# Patient Record
Sex: Female | Born: 1965 | Race: Black or African American | Hispanic: No | Smoking: Never smoker
Health system: Southern US, Community
[De-identification: ages and names within clinical notes are randomized; demographics above are authoritative.]

## PROBLEM LIST (undated history)

## (undated) DIAGNOSIS — R232 Flushing: Secondary | ICD-10-CM

## (undated) DIAGNOSIS — N852 Hypertrophy of uterus: Secondary | ICD-10-CM

## (undated) DIAGNOSIS — F32A Depression, unspecified: Secondary | ICD-10-CM

## (undated) DIAGNOSIS — R1031 Right lower quadrant pain: Secondary | ICD-10-CM

## (undated) DIAGNOSIS — I10 Essential (primary) hypertension: Secondary | ICD-10-CM

## (undated) DIAGNOSIS — F329 Major depressive disorder, single episode, unspecified: Secondary | ICD-10-CM

## (undated) DIAGNOSIS — F419 Anxiety disorder, unspecified: Secondary | ICD-10-CM

## (undated) DIAGNOSIS — E785 Hyperlipidemia, unspecified: Secondary | ICD-10-CM

## (undated) HISTORY — DX: Flushing: R23.2

## (undated) HISTORY — DX: Hypertrophy of uterus: N85.2

## (undated) HISTORY — DX: Essential (primary) hypertension: I10

## (undated) HISTORY — DX: Right lower quadrant pain: R10.31

## (undated) HISTORY — DX: Major depressive disorder, single episode, unspecified: F32.9

## (undated) HISTORY — DX: Anxiety disorder, unspecified: F41.9

## (undated) HISTORY — DX: Hyperlipidemia, unspecified: E78.5

## (undated) HISTORY — DX: Depression, unspecified: F32.A

## (undated) HISTORY — PX: KNEE SURGERY: SHX244

---

## 2001-01-03 ENCOUNTER — Ambulatory Visit (HOSPITAL_COMMUNITY): Admission: RE | Admit: 2001-01-03 | Discharge: 2001-01-03 | Payer: Self-pay | Admitting: Internal Medicine

## 2001-01-03 ENCOUNTER — Encounter: Payer: Self-pay | Admitting: Internal Medicine

## 2003-04-29 ENCOUNTER — Emergency Department (HOSPITAL_COMMUNITY): Admission: EM | Admit: 2003-04-29 | Discharge: 2003-04-29 | Payer: Self-pay | Admitting: Emergency Medicine

## 2004-08-17 ENCOUNTER — Ambulatory Visit: Payer: Self-pay | Admitting: Orthopedic Surgery

## 2004-10-18 ENCOUNTER — Ambulatory Visit: Payer: Self-pay | Admitting: Orthopedic Surgery

## 2004-11-19 ENCOUNTER — Ambulatory Visit (HOSPITAL_COMMUNITY): Admission: RE | Admit: 2004-11-19 | Discharge: 2004-11-19 | Payer: Self-pay | Admitting: Orthopedic Surgery

## 2004-11-23 ENCOUNTER — Encounter (HOSPITAL_COMMUNITY): Admission: RE | Admit: 2004-11-23 | Discharge: 2004-12-18 | Payer: Self-pay | Admitting: Orthopedic Surgery

## 2004-11-24 ENCOUNTER — Ambulatory Visit: Payer: Self-pay | Admitting: Orthopedic Surgery

## 2004-12-22 ENCOUNTER — Ambulatory Visit: Payer: Self-pay | Admitting: Orthopedic Surgery

## 2007-01-01 ENCOUNTER — Ambulatory Visit (HOSPITAL_COMMUNITY): Admission: RE | Admit: 2007-01-01 | Discharge: 2007-01-01 | Payer: Self-pay | Admitting: Obstetrics and Gynecology

## 2007-04-09 ENCOUNTER — Other Ambulatory Visit: Admission: RE | Admit: 2007-04-09 | Discharge: 2007-04-09 | Payer: Self-pay | Admitting: Obstetrics and Gynecology

## 2008-01-30 ENCOUNTER — Ambulatory Visit (HOSPITAL_COMMUNITY): Admission: RE | Admit: 2008-01-30 | Discharge: 2008-01-30 | Payer: Self-pay | Admitting: Internal Medicine

## 2008-05-15 ENCOUNTER — Other Ambulatory Visit: Admission: RE | Admit: 2008-05-15 | Discharge: 2008-05-15 | Payer: Self-pay | Admitting: Obstetrics and Gynecology

## 2009-02-11 ENCOUNTER — Ambulatory Visit (HOSPITAL_COMMUNITY): Admission: RE | Admit: 2009-02-11 | Discharge: 2009-02-11 | Payer: Self-pay | Admitting: Internal Medicine

## 2010-03-11 ENCOUNTER — Other Ambulatory Visit
Admission: RE | Admit: 2010-03-11 | Discharge: 2010-03-11 | Payer: Self-pay | Source: Home / Self Care | Admitting: Obstetrics and Gynecology

## 2010-03-11 ENCOUNTER — Ambulatory Visit (HOSPITAL_COMMUNITY)
Admission: RE | Admit: 2010-03-11 | Discharge: 2010-03-11 | Payer: Self-pay | Source: Home / Self Care | Attending: Obstetrics and Gynecology | Admitting: Obstetrics and Gynecology

## 2010-03-24 ENCOUNTER — Ambulatory Visit (HOSPITAL_COMMUNITY)
Admission: RE | Admit: 2010-03-24 | Discharge: 2010-03-24 | Payer: Self-pay | Source: Home / Self Care | Attending: Obstetrics and Gynecology | Admitting: Obstetrics and Gynecology

## 2010-04-11 ENCOUNTER — Encounter: Payer: Self-pay | Admitting: Internal Medicine

## 2010-04-11 ENCOUNTER — Encounter: Payer: Self-pay | Admitting: Obstetrics and Gynecology

## 2010-06-24 ENCOUNTER — Other Ambulatory Visit: Payer: Self-pay | Admitting: Internal Medicine

## 2010-06-24 DIAGNOSIS — Z09 Encounter for follow-up examination after completed treatment for conditions other than malignant neoplasm: Secondary | ICD-10-CM

## 2010-06-24 DIAGNOSIS — N63 Unspecified lump in unspecified breast: Secondary | ICD-10-CM

## 2010-08-06 NOTE — Op Note (Signed)
NAME:  [name], [name]              ACCOUNT NO.:  [id number]   MEDICAL RECORD NO.:  [id number]          PATIENT TYPE:  AMB   LOCATION:  DAY                           FACILITY:  [facility]   PHYSICIAN:  Vickki Hearing, M.D.DATE OF BIRTH:  08-14-1965   DATE OF PROCEDURE:  11/19/2004  DATE OF DISCHARGE:                                 OPERATIVE REPORT   PREOPERATIVE DIAGNOSIS:  Medial meniscal tear, left knee.   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   PROCEDURE:  Arthroscopy left knee lateral meniscectomy and chondroplasty of  the medial patellar facet.   SURGEON:  Vickki Hearing, M.D.   SPECIMENS:  None.   ESTIMATED BLOOD LOSS:  None.   COMPLICATIONS:  None.   DISPOSITION:  At the end of the procedure, the patient went to PACU in good  condition.   INDICATIONS:  Medial knee pain.   INDICATIONS:  This is a 45 year old female who was followed for several  months with pain in the medial aspect of her left knee.  She was treated  with anti-inflammatories and rest.  Her  mechanical symptoms continued, and  she presented for surgery after failed conservative therapy.   DESCRIPTION OF PROCEDURE:  The patient was identified in the preoperative  holding area as Carrie Santos.  Her left knee was marked for surgery and  countersigned by the surgeon.  History and physical was updated.  She was  given Ancef and taken to the operating for general anesthesia via LMA  technique.   After sterile prep and drape. A time-out was taken as required and completed  as required. The procedure was confirmed as left knee arthroscopy on Carrie Santos.   Using a lateral working viewing portal and medial working portal, a  diagnostic arthroscopy was performed.  Using a probe the intra-articular  structures were palpated. There was a free edge tear of the lateral  meniscus.  The medial meniscus was normal, and the medial compartment was  normal.   The medial facet of the patella had a grade I lesion with a  chondral flap of the medial facet.  There was some slight lateral  subluxation which corrected on knee flexion.   This was debrided with a straight shaver first in the lateral compartment  for the meniscus and then on the medial facet for the patella.   The knee was irrigated and the portals were closed with Steri-Strips. We  injected 30 cc of plain Marcaine in the knee.  We dressed the wound  sterilely, put on a cryo cuff over the bandages.  We extubated the patient  and took her to the recovery room again in stable condition.   DISCHARGE INSTRUCTIONS:  1.  She is discharged.  Follow up on Wednesday.  2.  Discharged on Lorcet Plus.  3.  She will take a cryo cuff home.  4.  She is full weightbearing.  Physical therapy can start on Tuesday.      Vickki Hearing, M.D.  Electronically Signed     SEH/MEDQ  D:  11/19/2004  T:  11/19/2004  Job:  [id number]

## 2010-08-06 NOTE — H&P (Signed)
NAME:  Carrie Santos, Carrie Santos              ACCOUNT NO.:  [id number]   MEDICAL RECORD NO.:  [id number]          PATIENT TYPE:  AMB   LOCATION:  DAY                           FACILITY:  [facility]   PHYSICIAN:  Vickki Hearing, M.D.DATE OF BIRTH:  Aug 10, 1965   DATE OF ADMISSION:  DATE OF DISCHARGE:  LH                                HISTORY & PHYSICAL   CHIEF COMPLAINT:  Left knee pain.   HISTORY OF PRESENT ILLNESS:  She is a 45 year old female who presented in  May of 2006 after twisting her knee.  She complains of ambulatory  dysfunction, medial knee pain, swelling, which is mild.  She has tried Aleve  with mild relief.  She did eventually come to have continued pain, catching,  and giving way of the left knee.  She was treated with NSAIDS and rest.  She  continued to have pain and mechanical symptoms and is diagnosed with medial  meniscal tear and presents for surgery.   REVIEW OF SYSTEMS:  Reveals no problems.  We reviewed 10 systems.  They were  normal.   ALLERGIES:  She is allergic to MICROBID.   PAST MEDICAL HISTORY:  She has no medical problems.   MEDICATIONS:  She takes a birth control pill and Lipitor.   PAST SURGICAL HISTORY:  No previous surgery.   FAMILY HISTORY:  She has a family history of diabetes.   SOCIAL HISTORY:  She is married.  She is a Child psychotherapist.  Family physician  is Dr. Felecia Shelling.  She does not smoke or drink.  She drinks caffeine two cups  per day.  She has 18 years of schooling to her credit.   PHYSICAL EXAMINATION:  GENERAL:  She is well-developed and nourished.  Grooming and hygiene are normal.  She has no gross deformity.  Body habitus  is mesomorphic.  She is alert and oriented x3 with normal sensation in the  limbs.  CARDIOVASCULAR:  Showed normal observation and palpation.  \  SKIN:  Her skin was normal in all four extremities.  MUSCULOSKELETAL:  Revealed range of motion of 0-125 degrees with some pain.  Strength and stability were normal.  On  inspection she had tenderness and  pain with flexion of the knee, a positive meniscal sign, and a mild joint  effusion.  Her other extremities showed normal range of motion, strength,  stability, alignment, and appearance.   LABORATORY DATA:  Radiographs:  Two views of the knee showed no acute  changes.   DIAGNOSIS:  Medial meniscal tear.   PLAN:  Arthroscopy, left knee.  Medial meniscectomy.   The patient has failed conservative treatment and agreed to proceed with  surgery because of persistent pain.  She understands the risks and benefits  of the procedure and agrees to proceed with arthroscopy of the left knee.      Vickki Hearing, M.D.  Electronically Signed     SEH/MEDQ  D:  11/18/2004  T:  11/18/2004  Job:  [id number]

## 2010-09-28 ENCOUNTER — Ambulatory Visit
Admission: RE | Admit: 2010-09-28 | Discharge: 2010-09-28 | Disposition: A | Discharge status: Home / Self Care | Payer: BC Managed Care – PPO | Source: Ambulatory Visit | Attending: Internal Medicine | Admitting: Internal Medicine

## 2010-09-28 ENCOUNTER — Other Ambulatory Visit: Payer: Self-pay | Admitting: Internal Medicine

## 2010-09-28 DIAGNOSIS — N63 Unspecified lump in unspecified breast: Secondary | ICD-10-CM

## 2011-03-01 ENCOUNTER — Other Ambulatory Visit: Payer: Self-pay | Admitting: Internal Medicine

## 2011-03-01 DIAGNOSIS — N63 Unspecified lump in unspecified breast: Secondary | ICD-10-CM

## 2011-03-01 DIAGNOSIS — N6009 Solitary cyst of unspecified breast: Secondary | ICD-10-CM

## 2011-04-11 ENCOUNTER — Other Ambulatory Visit (HOSPITAL_COMMUNITY)
Admission: RE | Admit: 2011-04-11 | Discharge: 2011-04-11 | Disposition: A | Discharge status: Home / Self Care | Payer: BC Managed Care – PPO | Source: Ambulatory Visit | Attending: Obstetrics and Gynecology | Admitting: Obstetrics and Gynecology

## 2011-04-11 ENCOUNTER — Ambulatory Visit
Admission: RE | Admit: 2011-04-11 | Discharge: 2011-04-11 | Disposition: A | Discharge status: Home / Self Care | Payer: BC Managed Care – PPO | Source: Ambulatory Visit | Attending: Internal Medicine | Admitting: Internal Medicine

## 2011-04-11 ENCOUNTER — Other Ambulatory Visit: Payer: Self-pay | Admitting: Adult Health

## 2011-04-11 DIAGNOSIS — N6009 Solitary cyst of unspecified breast: Secondary | ICD-10-CM

## 2011-04-11 DIAGNOSIS — Z113 Encounter for screening for infections with a predominantly sexual mode of transmission: Secondary | ICD-10-CM | POA: Insufficient documentation

## 2011-04-11 DIAGNOSIS — N63 Unspecified lump in unspecified breast: Secondary | ICD-10-CM

## 2011-04-11 DIAGNOSIS — Z01419 Encounter for gynecological examination (general) (routine) without abnormal findings: Secondary | ICD-10-CM | POA: Insufficient documentation

## 2012-11-21 ENCOUNTER — Other Ambulatory Visit: Payer: Self-pay | Admitting: Family Medicine

## 2012-11-21 ENCOUNTER — Other Ambulatory Visit: Payer: Self-pay | Admitting: *Deleted

## 2012-11-21 DIAGNOSIS — N6009 Solitary cyst of unspecified breast: Secondary | ICD-10-CM

## 2012-11-21 DIAGNOSIS — N63 Unspecified lump in unspecified breast: Secondary | ICD-10-CM

## 2012-12-10 ENCOUNTER — Ambulatory Visit: Payer: BC Managed Care – PPO

## 2012-12-10 ENCOUNTER — Ambulatory Visit
Admission: RE | Admit: 2012-12-10 | Discharge: 2012-12-10 | Disposition: A | Discharge status: Home / Self Care | Payer: BC Managed Care – PPO | Source: Ambulatory Visit | Attending: *Deleted | Admitting: *Deleted

## 2012-12-10 ENCOUNTER — Other Ambulatory Visit: Payer: Self-pay | Admitting: Family Medicine

## 2012-12-10 DIAGNOSIS — N6009 Solitary cyst of unspecified breast: Secondary | ICD-10-CM

## 2012-12-10 DIAGNOSIS — N63 Unspecified lump in unspecified breast: Secondary | ICD-10-CM

## 2013-12-11 ENCOUNTER — Other Ambulatory Visit: Payer: Self-pay

## 2013-12-11 ENCOUNTER — Ambulatory Visit
Admission: RE | Admit: 2013-12-11 | Discharge: 2013-12-11 | Disposition: A | Discharge status: Home / Self Care | Payer: BC Managed Care – PPO | Source: Ambulatory Visit

## 2013-12-11 ENCOUNTER — Encounter (INDEPENDENT_AMBULATORY_CARE_PROVIDER_SITE_OTHER): Payer: Self-pay

## 2013-12-11 DIAGNOSIS — Z1231 Encounter for screening mammogram for malignant neoplasm of breast: Secondary | ICD-10-CM

## 2014-04-07 ENCOUNTER — Other Ambulatory Visit: Payer: Self-pay | Admitting: Adult Health

## 2014-10-22 ENCOUNTER — Other Ambulatory Visit: Payer: Self-pay | Admitting: Adult Health

## 2014-11-05 ENCOUNTER — Other Ambulatory Visit: Payer: Self-pay | Admitting: Adult Health

## 2014-11-18 ENCOUNTER — Other Ambulatory Visit: Payer: Self-pay | Admitting: Adult Health

## 2014-12-02 ENCOUNTER — Ambulatory Visit (INDEPENDENT_AMBULATORY_CARE_PROVIDER_SITE_OTHER): Payer: BC Managed Care – PPO | Admitting: Adult Health

## 2014-12-02 ENCOUNTER — Other Ambulatory Visit (HOSPITAL_COMMUNITY)
Admission: RE | Admit: 2014-12-02 | Discharge: 2014-12-02 | Disposition: A | Discharge status: Home / Self Care | Payer: BC Managed Care – PPO | Source: Ambulatory Visit | Attending: Adult Health | Admitting: Adult Health

## 2014-12-02 ENCOUNTER — Encounter: Payer: Self-pay | Admitting: Adult Health

## 2014-12-02 VITALS — BP 150/90 | HR 84 | Ht 67.0 in | Wt 210.0 lb

## 2014-12-02 DIAGNOSIS — Z1151 Encounter for screening for human papillomavirus (HPV): Secondary | ICD-10-CM | POA: Diagnosis present

## 2014-12-02 DIAGNOSIS — Z1212 Encounter for screening for malignant neoplasm of rectum: Secondary | ICD-10-CM

## 2014-12-02 DIAGNOSIS — N852 Hypertrophy of uterus: Secondary | ICD-10-CM

## 2014-12-02 DIAGNOSIS — R232 Flushing: Secondary | ICD-10-CM

## 2014-12-02 DIAGNOSIS — Z01419 Encounter for gynecological examination (general) (routine) without abnormal findings: Secondary | ICD-10-CM

## 2014-12-02 DIAGNOSIS — R1031 Right lower quadrant pain: Secondary | ICD-10-CM | POA: Insufficient documentation

## 2014-12-02 HISTORY — DX: Right lower quadrant pain: R10.31

## 2014-12-02 HISTORY — DX: Hypertrophy of uterus: N85.2

## 2014-12-02 HISTORY — DX: Flushing: R23.2

## 2014-12-02 LAB — HEMOCCULT GUIAC POC 1CARD (OFFICE): FECAL OCCULT BLD: NEGATIVE

## 2014-12-02 MED ORDER — IBUPROFEN 800 MG PO TABS: 800.0000 mg | ORAL_TABLET | Freq: Three times a day (TID) | ORAL | Status: DC | PRN

## 2014-12-02 NOTE — Progress Notes (Signed)
Patient ID: Carrie Santos, female   DOB: 01-21-1966, 49 y.o.   MRN: [id number] History of Present Illness: Carrie Santos is a 49 year old black female,married in for a well woman gyn exam and pap and she complains of right sided pain and hot flashes.Periods are regular. PCP in [place], lives in [place] now.  Current Medications, Allergies, Past Medical History, Past Surgical History, Family History and Social History were reviewed in [employer] record.     Review of Systems: Patient denies any headaches, hearing loss, fatigue, blurred vision, shortness of breath, chest pain, problems with bowel movements, urination, or intercourse. No joint pain or mood swings.See HPI for positives.    Physical Exam:BP 150/90 mmHg  Pulse 84  Ht 5\' 7"  (1.702 m)  Wt 210 lb (95.255 kg)  BMI 32.88 kg/m2  LMP 11/22/2014 General:  Well developed, well nourished, no acute distress Skin:  Warm and dry Neck:  Midline trachea, normal thyroid, good ROM, no lymphadenopathy Lungs; Clear to auscultation bilaterally Breast:  No dominant palpable mass, retraction, or nipple discharge Cardiovascular: Regular rate and rhythm Abdomen:  Soft, non tender, no hepatosplenomegaly Pelvic:  External genitalia is normal in appearance, no lesions.  The vagina is normal in appearance. Urethra has no lesions or masses. The cervix is bulbous. Pap with HPV performed. Uterus is felt to be enlarged,about 18 week size and irregular to right and tender.  No adnexal masses,RLQ tenderness noted.Bladder is non tender, no masses felt. Rectal: Good sphincter tone, no polyps, or hemorrhoids felt.  Hemoccult negative. Extremities/musculoskeletal:  No swelling or varicosities noted, no clubbing or cyanosis Psych:  No mood changes, alert and cooperative,seems happy Discussed could be fibroids will get [place].  Impression: Well woman gyn exam and pap RLQ pain Enlarged uterus Hot flashes    Plan: Return in 1 day for gyn  [place] Physical in 1 year,pap in 3 if normal Mammogram yearly Colonoscopy advised Labs with PCP Rx motrin 800 mg #60 take 1 every 8 hours prn pain with 1 refill Review handout on pelvic pain and menopause

## 2014-12-02 NOTE — Patient Instructions (Addendum)
Pelvic Pain Female pelvic pain can be caused by many different things and start from a variety of places. Pelvic pain refers to pain that is located in the lower half of the abdomen and between your hips. The pain may occur over a short period of time (acute) or may be reoccurring (chronic). The cause of pelvic pain may be related to disorders affecting the female reproductive organs (gynecologic), but it may also be related to the bladder, kidney stones, an intestinal complication, or muscle or skeletal problems. Getting help right away for pelvic pain is important, especially if there has been severe, sharp, or a sudden onset of unusual pain. It is also important to get help right away because some types of pelvic pain can be life threatening.  CAUSES  Below are only some of the causes of pelvic pain. The causes of pelvic pain can be in one of several categories.   Gynecologic.  Pelvic inflammatory disease.  Sexually transmitted infection.  Ovarian cyst or a twisted ovarian ligament (ovarian torsion).  Uterine lining that grows outside the uterus (endometriosis).  Fibroids, cysts, or tumors.  Ovulation.  Pregnancy.  Pregnancy that occurs outside the uterus (ectopic pregnancy).  Miscarriage.  Labor.  Abruption of the placenta or ruptured uterus.  Infection.  Uterine infection (endometritis).  Bladder infection.  Diverticulitis.  Miscarriage related to a uterine infection (septic abortion).  Bladder.  Inflammation of the bladder (cystitis).  Kidney stone(s).  Gastrointestinal.  Constipation.  Diverticulitis.  Neurologic.  Trauma.  Feeling pelvic pain because of mental or emotional causes (psychosomatic).  Cancers of the bowel or pelvis. EVALUATION  Your caregiver will want to take a careful history of your concerns. This includes recent changes in your health, a careful gynecologic history of your periods (menses), and a sexual history. Obtaining your family  history and medical history is also important. Your caregiver may suggest a pelvic exam. A pelvic exam will help identify the location and severity of the pain. It also helps in the evaluation of which organ system may be involved. In order to identify the cause of the pelvic pain and be properly treated, your caregiver may order tests. These tests may include:   A pregnancy test.  Pelvic ultrasonography.  An X-ray exam of the abdomen.  A urinalysis or evaluation of vaginal discharge.  Blood tests. HOME CARE INSTRUCTIONS   Only take over-the-counter or prescription medicines for pain, discomfort, or fever as directed by your caregiver.   Rest as directed by your caregiver.   Eat a balanced diet.   Drink enough fluids to make your urine clear or pale yellow, or as directed.   Avoid sexual intercourse if it causes pain.   Apply warm or cold compresses to the lower abdomen depending on which one helps the pain.   Avoid stressful situations.   Keep a journal of your pelvic pain. Write down when it started, where the pain is located, and if there are things that seem to be associated with the pain, such as food or your menstrual cycle.  Follow up with your caregiver as directed.  SEEK MEDICAL CARE IF:  Your medicine does not help your pain.  You have abnormal vaginal discharge. SEEK IMMEDIATE MEDICAL CARE IF:   You have heavy bleeding from the vagina.   Your pelvic pain increases.   You feel light-headed or faint.   You have chills.   You have pain with urination or blood in your urine.   You have uncontrolled diarrhea   or vomiting.   You have a fever or persistent symptoms for more than 3 days.  You have a fever and your symptoms suddenly get worse.   You are being physically or sexually abused.  MAKE SURE YOU:  Understand these instructions.  Will watch your condition.  Will get help if you are not doing well or get worse. Document Released:  02/02/2004 Document Revised: 07/22/2013 Document Reviewed: 06/27/2011 [facility] Patient Information 2015 [place], [place]. This information is not intended to replace advice given to you by your health care provider. Make sure you discuss any questions you have with your health care provider. Return in 1 day for [place]  Physical in 1 year Mammogram yearly Colonoscopy advised Menopause Menopause is the normal time of life when menstrual periods stop completely. Menopause is complete when you have missed 12 consecutive menstrual periods. It usually occurs between the ages of 55 years and 63 years. Very rarely does a woman develop menopause before the age of 2 years. At menopause, your ovaries stop producing the female hormones estrogen and progesterone. This can cause undesirable symptoms and also affect your health. Sometimes the symptoms may occur 4-5 years before the menopause begins. There is no relationship between menopause and:  Oral contraceptives.  Number of children you had.  Race.  The age your menstrual periods started (menarche). Heavy smokers and very thin women may develop menopause earlier in life. CAUSES  The ovaries stop producing the female hormones estrogen and progesterone.  Other causes include:  Surgery to remove both ovaries.  The ovaries stop functioning for no known reason.  Tumors of the pituitary gland in the brain.  Medical disease that affects the ovaries and hormone production.  Radiation treatment to the abdomen or pelvis.  Chemotherapy that affects the ovaries. SYMPTOMS   Hot flashes.  Night sweats.  Decrease in sex drive.  Vaginal dryness and thinning of the vagina causing painful intercourse.  Dryness of the skin and developing wrinkles.  Headaches.  Tiredness.  Irritability.  Memory problems.  Weight gain.  Bladder infections.  Hair growth of the face and chest.  Infertility. More serious symptoms include:  Loss of bone  (osteoporosis) causing breaks (fractures).  Depression.  Hardening and narrowing of the arteries (atherosclerosis) causing heart attacks and strokes. DIAGNOSIS   When the menstrual periods have stopped for 12 straight months.  Physical exam.  Hormone studies of the blood. TREATMENT  There are many treatment choices and nearly as many questions about them. The decisions to treat or not to treat menopausal changes is an individual choice made with your health care provider. Your health care provider can discuss the treatments with you. Together, you can decide which treatment will work best for you. Your treatment choices may include:   Hormone therapy (estrogen and progesterone).  Non-hormonal medicines.  Treating the individual symptoms with medicine (for example antidepressants for depression).  Herbal medicines that may help specific symptoms.  Counseling by a psychiatrist or psychologist.  Group therapy.  Lifestyle changes including:  Eating healthy.  Regular exercise.  Limiting caffeine and alcohol.  Stress management and meditation.  No treatment. HOME CARE INSTRUCTIONS   Take the medicine your health care provider gives you as directed.  Get plenty of sleep and rest.  Exercise regularly.  Eat a diet that contains calcium (good for the bones) and soy products (acts like estrogen hormone).  Avoid alcoholic beverages.  Do not smoke.  If you have hot flashes, dress in layers.  Take supplements,  calcium, and vitamin D to strengthen bones.  You can use over-the-counter lubricants or moisturizers for vaginal dryness.  Group therapy is sometimes very helpful.  Acupuncture may be helpful in some cases. SEEK MEDICAL CARE IF:   You are not sure you are in menopause.  You are having menopausal symptoms and need advice and treatment.  You are still having menstrual periods after age 89 years.  You have pain with intercourse.  Menopause is complete (no  menstrual period for 12 months) and you develop vaginal bleeding.  You need a referral to a specialist (gynecologist, psychiatrist, or psychologist) for treatment. SEEK IMMEDIATE MEDICAL CARE IF:   You have severe depression.  You have excessive vaginal bleeding.  You fell and think you have a broken bone.  You have pain when you urinate.  You develop leg or chest pain.  You have a fast pounding heart beat (palpitations).  You have severe headaches.  You develop vision problems.  You feel a lump in your breast.  You have abdominal pain or severe indigestion. Document Released: 05/28/2003 Document Revised: 11/07/2012 Document Reviewed: 10/04/2012 [facility] Patient Information 2015 [place], [place]. This information is not intended to replace advice given to you by your health care provider. Make sure you discuss any questions you have with your health care provider.

## 2014-12-03 ENCOUNTER — Ambulatory Visit (INDEPENDENT_AMBULATORY_CARE_PROVIDER_SITE_OTHER): Payer: BC Managed Care – PPO

## 2014-12-03 ENCOUNTER — Other Ambulatory Visit: Payer: Self-pay | Admitting: Adult Health

## 2014-12-03 DIAGNOSIS — R1031 Right lower quadrant pain: Secondary | ICD-10-CM | POA: Diagnosis not present

## 2014-12-03 DIAGNOSIS — N852 Hypertrophy of uterus: Secondary | ICD-10-CM

## 2014-12-03 LAB — CYTOLOGY - PAP

## 2014-12-04 ENCOUNTER — Telehealth: Payer: Self-pay | Admitting: Adult Health

## 2014-12-04 NOTE — Telephone Encounter (Signed)
Pt aware of [place] and fibroids and that hysterectomy is option, will call back if wants to discuss further

## 2015-02-20 ENCOUNTER — Other Ambulatory Visit: Payer: Self-pay

## 2015-02-20 DIAGNOSIS — Z1231 Encounter for screening mammogram for malignant neoplasm of breast: Secondary | ICD-10-CM

## 2015-02-23 ENCOUNTER — Other Ambulatory Visit: Payer: Self-pay | Admitting: Adult Health

## 2015-03-17 ENCOUNTER — Ambulatory Visit
Admission: RE | Admit: 2015-03-17 | Discharge: 2015-03-17 | Disposition: A | Discharge status: Home / Self Care | Payer: BC Managed Care – PPO | Source: Ambulatory Visit

## 2015-03-17 DIAGNOSIS — Z1231 Encounter for screening mammogram for malignant neoplasm of breast: Secondary | ICD-10-CM

## 2015-03-19 ENCOUNTER — Ambulatory Visit: Payer: BC Managed Care – PPO

## 2015-07-07 ENCOUNTER — Other Ambulatory Visit: Payer: Self-pay | Admitting: Adult Health

## 2015-09-21 ENCOUNTER — Encounter: Payer: Self-pay | Admitting: Adult Health

## 2015-09-21 ENCOUNTER — Ambulatory Visit (INDEPENDENT_AMBULATORY_CARE_PROVIDER_SITE_OTHER): Payer: BC Managed Care – PPO | Admitting: Adult Health

## 2015-09-21 VITALS — BP 150/90 | HR 98 | Ht 68.0 in | Wt 211.5 lb

## 2015-09-21 DIAGNOSIS — N852 Hypertrophy of uterus: Secondary | ICD-10-CM

## 2015-09-21 DIAGNOSIS — F419 Anxiety disorder, unspecified: Secondary | ICD-10-CM | POA: Diagnosis not present

## 2015-09-21 HISTORY — DX: Anxiety disorder, unspecified: F41.9

## 2015-09-21 MED ORDER — ALPRAZOLAM 0.5 MG PO TABS: 0.5000 mg | ORAL_TABLET | Freq: Two times a day (BID) | ORAL | Status: DC | PRN

## 2015-09-21 MED ORDER — SERTRALINE HCL 50 MG PO TABS: ORAL_TABLET | ORAL | Status: DC

## 2015-09-21 NOTE — Patient Instructions (Signed)
Generalized Anxiety Disorder Generalized anxiety disorder (GAD) is a mental disorder. It interferes with life functions, including relationships, work, and school. GAD is different from normal anxiety, which everyone experiences at some point in their lives in response to specific life events and activities. Normal anxiety actually helps [place] prepare for and get through these life events and activities. Normal anxiety goes away after the event or activity is over.  GAD causes anxiety that is not necessarily related to specific events or activities. It also causes excess anxiety in proportion to specific events or activities. The anxiety associated with GAD is also difficult to control. GAD can vary from mild to severe. People with severe GAD can have intense waves of anxiety with physical symptoms (panic attacks).  SYMPTOMS The anxiety and worry associated with GAD are difficult to control. This anxiety and worry are related to many life events and activities and also occur more days than not for 6 months or longer. People with GAD also have three or more of the following symptoms (one or more in children):  Restlessness.   Fatigue.  Difficulty concentrating.   Irritability.  Muscle tension.  Difficulty sleeping or unsatisfying sleep. DIAGNOSIS GAD is diagnosed through an assessment by your health care provider. Your health care provider will ask you questions aboutyour mood,physical symptoms, and events in your life. Your health care provider may ask you about your medical history and use of alcohol or drugs, including prescription medicines. Your health care provider may also do a physical exam and blood tests. Certain medical conditions and the use of certain substances can cause symptoms similar to those associated with GAD. Your health care provider may refer you to a mental health specialist for further evaluation. TREATMENT The following therapies are usually used to treat GAD:    Medication. Antidepressant medication usually is prescribed for long-term daily control. Antianxiety medicines may be added in severe cases, especially when panic attacks occur.   Talk therapy (psychotherapy). Certain types of talk therapy can be helpful in treating GAD by providing support, education, and guidance. A form of talk therapy called cognitive behavioral therapy can teach you healthy ways to think about and react to daily life events and activities.  Stress managementtechniques. These include yoga, meditation, and exercise and can be very helpful when they are practiced regularly. A mental health specialist can help determine which treatment is best for you. Some people see improvement with one therapy. However, other people require a combination of therapies.   This information is not intended to replace advice given to you by your health care provider. Make sure you discuss any questions you have with your health care provider.   Document Released: 07/02/2012 Document Revised: 03/28/2014 Document Reviewed: 07/02/2012 Elsevier Interactive Patient Education 2016 [place] 1/2 tab for 1 week then 1 daily  Follow up in 4 weeks

## 2015-09-21 NOTE — Progress Notes (Signed)
Subjective:     Patient ID: Carrie Santos, female   DOB: Feb 14, 1966, 50 y.o.   MRN: [id number]  HPI Carrie Santos is a 50 year old black female in to discuss her emotions, she is teary easily and stressed, she says she is not depressed and denies any suicidal ideations.She says waist fuller and wants to shrink fibroids, does not want surgery.   Review of Systems +anxiety and stress, +feels fuller at waist Reviewed past medical,surgical, social and family history. Reviewed medications and allergies.     Objective:   Physical Exam BP 160/90 mmHg  Pulse 98  Ht 5\' 8"  (1.727 m)  Wt 211 lb 8 oz (95.936 kg)  BMI 32.17 kg/m2  LMP 09/14/2015   BP recheck 150/90, Skin warm and dry.Lungs: clear to ausculation bilaterally. Cardiovascular: regular rate and rhythm.She says she is anxious, will start back on Zoloft, declines xanax.  Assessment:     Anxiety  Enlarged uterus-fibroid     Plan:    Review handout on anxiety Rx zoloft 50 mg take 1/2 tab daily for 1 week then 1 daily #30 with 6 refills Follow up in 4 weeks to assess how zoloft working and talk with Dr Glo Herring about meds for fibroid

## 2015-10-09 ENCOUNTER — Other Ambulatory Visit: Payer: Self-pay | Admitting: Adult Health

## 2015-10-19 ENCOUNTER — Ambulatory Visit (INDEPENDENT_AMBULATORY_CARE_PROVIDER_SITE_OTHER): Payer: BC Managed Care – PPO | Admitting: Obstetrics and Gynecology

## 2015-10-19 ENCOUNTER — Encounter: Payer: Self-pay | Admitting: Obstetrics and Gynecology

## 2015-10-19 VITALS — BP 160/96 | Ht 68.0 in | Wt 208.0 lb

## 2015-10-19 DIAGNOSIS — R1031 Right lower quadrant pain: Secondary | ICD-10-CM | POA: Diagnosis not present

## 2015-10-19 DIAGNOSIS — D251 Intramural leiomyoma of uterus: Secondary | ICD-10-CM | POA: Diagnosis not present

## 2015-10-19 DIAGNOSIS — N852 Hypertrophy of uterus: Secondary | ICD-10-CM | POA: Diagnosis not present

## 2015-10-19 NOTE — Progress Notes (Addendum)
Patient ID: Carrie Santos, female   DOB: [birth year], 50 y.o.   MRN: [id number]    [facility] Visit  @DATE @            Patient name: Carrie Santos MRN [id number]  Date of birth: Jun 15, 1965  CC & HPI:  Carrie Santos is a 50 y.o. female presenting today for discussion of non-surgical fibroid management. Pt states she experiences ongoing RLQ pain, that is worsened in certain positions and when she lies on her right side. Pelvic US on 12/03/14 showed 17.3 x 12.1 x 12.5 cm, diffusely fibrotic uterus with largest fibroid measuring 7.2 x 5 x 7.5 cm. Pt states she is not interested in surgery and is interested in pursuing medication to shrink fibroids.   ROS:  Review of Systems  Gastrointestinal: Positive for abdominal pain (ongoing).    Pertinent History Reviewed:   Reviewed: Significant for enlarged uterus, fibroids  Medical         Past Medical History:  Diagnosis Date   Anxiety 09/21/2015   Depression    Enlarged uterus 12/02/2014   Hot flashes 12/02/2014   Hyperlipidemia    Hypertension    RLQ abdominal pain 12/02/2014                              Surgical Hx:    Past Surgical History:  Procedure Laterality Date   KNEE SURGERY Left    Medications: Reviewed & Updated - see associated section                       Current Outpatient Prescriptions:    atorvastatin (LIPITOR) 40 MG tablet, 40 mg daily. , Disp: , Rfl:    cholecalciferol (VITAMIN D) 1000 UNITS tablet, Take 5,000 Units by mouth daily., Disp: , Rfl:    ibuprofen (ADVIL,MOTRIN) 800 MG tablet, TAKE 1 TABLET (800 MG TOTAL) BY MOUTH EVERY 8 (EIGHT) HOURS AS NEEDED., Disp: 60 tablet, Rfl: 1   losartan (COZAAR) 50 MG tablet, 50 mg daily. , Disp: , Rfl:    Multiple Vitamin (MULTIVITAMIN) tablet, Take 1 tablet by mouth daily., Disp: , Rfl:    sertraline (ZOLOFT) 50 MG tablet, Take 1/2 tab for 1 week then 1 daily, Disp: 30 tablet, Rfl: 6   Social History: Reviewed -  reports that she has never smoked. She  has never used smokeless tobacco.  Objective Findings:  Vitals: Blood pressure (!) 160/96, height 5\' 8"  (1.727 m), weight 208 lb (94.3 kg), last menstrual period 10/11/2015.  Physical Examination: Discussion only  Discussed with pt non-surgical options to shrink fibroids. Discussed risks and benefits of Lupron vs fibroid embolization. Discussed each option extensively and advised pt that fibroid embolization would be the better option should she wish to pursue non-surgical treatment over surgical procedure. Advised pt that Lupron is not a long term solution in her case. At end of discussion, pt had opportunity to ask questions and has no further questions at this time.   Greater than 50% was spent in counseling and coordination of care with the patient. Total time greater than: 25 minutes   Assessment & Plan:   A:  1. Encounter for discussion of fibroid management  P:  1. Pt offered referral for embolization  2. Pt wishes to decline any further treatment at this time 3. Advised pt to sign up for MyChart 4. F/u annually or PRN    By  signing my name below, I, Hansel Feinstein, attest that this documentation has been prepared under the direction and in the presence of Jonnie Kind, MD. Electronically Signed: Hansel Feinstein, ED Scribe. 10/19/15. 9:28 AM.  I personally performed the services described in this documentation, which was SCRIBED in my presence. The recorded information has been reviewed and considered accurate. It has been edited as necessary during review. Jonnie Kind, MD

## 2015-10-19 NOTE — Patient Instructions (Signed)
Uterine Artery Embolization for Fibroids Uterine artery embolization is a nonsurgical treatment to shrink fibroids. A thin plastic tube (catheter) is used to inject material that blocks off the blood supply to the fibroid, which causes the fibroid to shrink. LET [facility] CARE PROVIDER KNOW ABOUT:  Any allergies you have.  All medicines you are taking, including vitamins, herbs, eye drops, creams, and over-the-counter medicines.  Previous problems you or members of your family have had with the use of anesthetics.  Any blood disorders you have.  Previous surgeries you have had.  Medical conditions you have. RISKS AND COMPLICATIONS  Injury to the uterus from decreased blood supply  Infection.  Blood infection (septicemia).  Lack of menstrual periods (amenorrhea).  Death of tissue cells (necrosis) around your bladder or vulva.  Development of a hole between organs or from an organ to the surface of your skin (fistula).  Blood clot in the legs (deep vein thrombosis) or lung (pulmonary embolus). BEFORE THE PROCEDURE  Ask your health care provider about changing or stopping your regular medicines.   Do not take aspirin or blood thinners (anticoagulants) for 1 week before the surgery or as directed by your health care provider.  Do not eat or drink anything for 8 hours before the surgery or as directed by your health care provider.   Empty your bladder before the procedure begins. PROCEDURE   An IV tube will be placed into one of your veins. This will be used to give you a sedative and pain medication (conscious sedation).  You will be given a medicine that numbs the area (local anesthetic).  A small cut will be made in your groin. A catheter is then inserted into the main artery of your leg.  The catheter will be guided through the artery to your uterus. A series of images will be taken while dye is injected through the catheter in your groin. X-rays are taken at the  same time. This is done to provide a road map of the blood supply to your uterus and fibroids.  Tiny plastic spheres, about the size of sand grains, will be injected through the catheter. Metal coils may be used to help block the artery. The particles will lodge in tiny branches of the uterine artery that supplies blood to the fibroids.  The procedure is repeated on the artery that supplies the other side of the uterus.  The catheter is then removed and pressure is held to stop any bleeding. No stitches are needed.  A dressing is then placed over the cut (incision). AFTER THE PROCEDURE  You will be taken to a recovery area where your progress will be monitored until you are awake, stable, and taking fluids well. If there are no other problems, you will then be moved to a regular hospital room.  You will be observed overnight in the hospital.  You will have cramping that should be controlled with pain medication.   This information is not intended to replace advice given to you by your health care provider. Make sure you discuss any questions you have with your health care provider.   Document Released: 05/23/2005 Document Revised: 12/26/2012 Document Reviewed: 09/20/2012 Elsevier Interactive Patient Education [employer].

## 2015-10-27 NOTE — Progress Notes (Signed)
Patient ID: DANAMARIE KUECHLER, female   DOB: [birth year], 50 y.o.   MRN: [id number]    [facility] Visit  @DATE @            Patient name: ZYONA MURLEY MRN [id number]  Date of birth: Jan 22, 1966  CC & HPI:  MYLOVE SALMONSON is a 50 y.o. female presenting today for discussion of non-surgical fibroid management. Pt states she experiences ongoing RLQ pain, that is worsened in certain positions and when she lies on her right side. Pelvic US on 12/03/14 showed 17.3 x 12.1 x 12.5 cm, diffusely fibrotic uterus with largest fibroid measuring 7.2 x 5 x 7.5 cm. Pt states she is not interested in surgery and is interested in pursuing medication to shrink fibroids.   ROS:  Review of Systems  Gastrointestinal: Positive for abdominal pain (ongoing).    Pertinent History Reviewed:   Reviewed: Significant for enlarged uterus, fibroids  Medical         Past Medical History:  Diagnosis Date  . Anxiety 09/21/2015  . Depression   . Enlarged uterus 12/02/2014  . Hot flashes 12/02/2014  . Hyperlipidemia   . Hypertension   . RLQ abdominal pain 12/02/2014                              Surgical Hx:    Past Surgical History:  Procedure Laterality Date  . KNEE SURGERY Left    Medications: Reviewed & Updated - see associated section                       Current Outpatient Prescriptions:  .  atorvastatin (LIPITOR) 40 MG tablet, 40 mg daily. , Disp: , Rfl:  .  cholecalciferol (VITAMIN D) 1000 UNITS tablet, Take 5,000 Units by mouth daily., Disp: , Rfl:  .  ibuprofen (ADVIL,MOTRIN) 800 MG tablet, TAKE 1 TABLET (800 MG TOTAL) BY MOUTH EVERY 8 (EIGHT) HOURS AS NEEDED., Disp: 60 tablet, Rfl: 1 .  losartan (COZAAR) 50 MG tablet, 50 mg daily. , Disp: , Rfl:  .  Multiple Vitamin (MULTIVITAMIN) tablet, Take 1 tablet by mouth daily., Disp: , Rfl:  .  sertraline (ZOLOFT) 50 MG tablet, Take 1/2 tab for 1 week then 1 daily, Disp: 30 tablet, Rfl: 6   Social History: Reviewed -  reports that she has never smoked. She  has never used smokeless tobacco.  Objective Findings:  Vitals: Blood pressure (!) 160/96, height 5\' 8"  (1.727 m), weight 208 lb (94.3 kg), last menstrual period 10/11/2015.  Physical Examination: Discussion only  Discussed with pt non-surgical options to shrink fibroids. Discussed risks and benefits of Lupron vs fibroid embolization. Discussed each option extensively and advised pt that fibroid embolization would be the better option should she wish to pursue non-surgical treatment over surgical procedure. Advised pt that Lupron is not a long term solution in her case. At end of discussion, pt had opportunity to ask questions and has no further questions at this time.   Greater than 50% was spent in counseling and coordination of care with the patient. Total time greater than: 25 minutes   Assessment & Plan:   A:  1. Encounter for discussion of fibroid management  P:  1. Pt offered referral for embolization  2. Pt wishes to decline any further treatment at this time 3. Advised pt to sign up for MyChart 4. F/u annually or PRN    By  signing my name below, I, Hansel Feinstein, attest that this documentation has been prepared under the direction and in the presence of Jonnie Kind, MD. Electronically Signed: Hansel Feinstein, ED Scribe. 10/19/15. 9:28 AM.  I personally performed the services described in this documentation, which was SCRIBED in my presence. The recorded information has been reviewed and considered accurate. It has been edited as necessary during review. Jonnie Kind, MD

## 2016-02-01 ENCOUNTER — Other Ambulatory Visit: Payer: Self-pay | Admitting: Adult Health

## 2016-04-30 ENCOUNTER — Other Ambulatory Visit: Payer: Self-pay | Admitting: Adult Health

## 2016-05-10 ENCOUNTER — Other Ambulatory Visit: Payer: Self-pay | Admitting: Adult Health

## 2016-05-18 ENCOUNTER — Other Ambulatory Visit: Payer: Self-pay | Admitting: Internal Medicine

## 2016-05-18 DIAGNOSIS — Z1231 Encounter for screening mammogram for malignant neoplasm of breast: Secondary | ICD-10-CM

## 2016-06-17 ENCOUNTER — Ambulatory Visit
Admission: RE | Admit: 2016-06-17 | Discharge: 2016-06-17 | Disposition: A | Discharge status: Home / Self Care | Payer: BC Managed Care – PPO | Source: Ambulatory Visit | Attending: Internal Medicine | Admitting: Internal Medicine

## 2016-06-17 DIAGNOSIS — Z1231 Encounter for screening mammogram for malignant neoplasm of breast: Secondary | ICD-10-CM

## 2016-10-14 ENCOUNTER — Telehealth: Payer: Self-pay | Admitting: Adult Health

## 2016-10-14 MED ORDER — IBUPROFEN 800 MG PO TABS: ORAL_TABLET | ORAL | 1 refills | Status: DC

## 2016-10-14 NOTE — Telephone Encounter (Signed)
Refilled motrin

## 2016-10-14 NOTE — Telephone Encounter (Signed)
Spoke with pt. Pt is requesting Ibuprofen 800 mg. Please advise. Thanks!! [place]

## 2016-10-14 NOTE — Telephone Encounter (Signed)
Patient called stating that she would like a refill of her Ibuprofen 800 send to another Pharmacy that is closer to her home,[facility] pharmacy in [place]. Please contact pt when prescription has been sent

## 2017-03-15 ENCOUNTER — Other Ambulatory Visit: Payer: Self-pay | Admitting: Adult Health

## 2017-03-22 ENCOUNTER — Other Ambulatory Visit (HOSPITAL_COMMUNITY)
Admission: RE | Admit: 2017-03-22 | Discharge: 2017-03-22 | Disposition: A | Discharge status: Home / Self Care | Payer: BC Managed Care – PPO | Source: Ambulatory Visit | Attending: Adult Health | Admitting: Adult Health

## 2017-03-22 DIAGNOSIS — Z1212 Encounter for screening for malignant neoplasm of rectum: Secondary | ICD-10-CM | POA: Insufficient documentation

## 2017-03-22 DIAGNOSIS — Z1211 Encounter for screening for malignant neoplasm of colon: Secondary | ICD-10-CM | POA: Insufficient documentation

## 2017-03-22 DIAGNOSIS — Z01419 Encounter for gynecological examination (general) (routine) without abnormal findings: Secondary | ICD-10-CM | POA: Insufficient documentation

## 2017-04-10 ENCOUNTER — Ambulatory Visit (INDEPENDENT_AMBULATORY_CARE_PROVIDER_SITE_OTHER): Payer: BC Managed Care – PPO | Admitting: Adult Health

## 2017-04-10 ENCOUNTER — Encounter: Payer: Self-pay | Admitting: Adult Health

## 2017-04-10 VITALS — BP 130/80 | HR 70 | Resp 16 | Ht 68.0 in | Wt 219.0 lb

## 2017-04-10 DIAGNOSIS — F419 Anxiety disorder, unspecified: Secondary | ICD-10-CM | POA: Diagnosis not present

## 2017-04-10 DIAGNOSIS — Z1212 Encounter for screening for malignant neoplasm of rectum: Secondary | ICD-10-CM | POA: Diagnosis present

## 2017-04-10 DIAGNOSIS — N951 Menopausal and female climacteric states: Secondary | ICD-10-CM | POA: Diagnosis not present

## 2017-04-10 DIAGNOSIS — Z01411 Encounter for gynecological examination (general) (routine) with abnormal findings: Secondary | ICD-10-CM

## 2017-04-10 DIAGNOSIS — Z1211 Encounter for screening for malignant neoplasm of colon: Secondary | ICD-10-CM | POA: Diagnosis present

## 2017-04-10 DIAGNOSIS — N852 Hypertrophy of uterus: Secondary | ICD-10-CM

## 2017-04-10 DIAGNOSIS — Z01419 Encounter for gynecological examination (general) (routine) without abnormal findings: Secondary | ICD-10-CM | POA: Diagnosis not present

## 2017-04-10 LAB — HEMOCCULT GUIAC POC 1CARD (OFFICE): Fecal Occult Blood, POC: NEGATIVE

## 2017-04-10 MED ORDER — SERTRALINE HCL 50 MG PO TABS: ORAL_TABLET | ORAL | 4 refills | Status: DC

## 2017-04-10 NOTE — Patient Instructions (Signed)
Menopause Menopause is the normal time of life when menstrual periods stop completely. Menopause is complete when you have missed 12 consecutive menstrual periods. It usually occurs between the ages of 48 years and 55 years. Very rarely does a woman develop menopause before the age of 40 years. At menopause, your ovaries stop producing the female hormones estrogen and progesterone. This can cause undesirable symptoms and also affect your health. Sometimes the symptoms may occur 4-5 years before the menopause begins. There is no relationship between menopause and:  Oral contraceptives.  Number of children you had.  Race.  The age your menstrual periods started (menarche).  Heavy smokers and very thin women may develop menopause earlier in life. What are the causes?  The ovaries stop producing the female hormones estrogen and progesterone. Other causes include:  Surgery to remove both ovaries.  The ovaries stop functioning for no known reason.  Tumors of the pituitary gland in the brain.  Medical disease that affects the ovaries and hormone production.  Radiation treatment to the abdomen or pelvis.  Chemotherapy that affects the ovaries.  What are the signs or symptoms?  Hot flashes.  Night sweats.  Decrease in sex drive.  Vaginal dryness and thinning of the vagina causing painful intercourse.  Dryness of the skin and developing wrinkles.  Headaches.  Tiredness.  Irritability.  Memory problems.  Weight gain.  Bladder infections.  Hair growth of the face and chest.  Infertility. More serious symptoms include:  Loss of bone (osteoporosis) causing breaks (fractures).  Depression.  Hardening and narrowing of the arteries (atherosclerosis) causing heart attacks and strokes.  How is this diagnosed?  When the menstrual periods have stopped for 12 straight months.  Physical exam.  Hormone studies of the blood. How is this treated? There are many treatment  choices and nearly as many questions about them. The decisions to treat or not to treat menopausal changes is an individual choice made with your health care provider. Your health care provider can discuss the treatments with you. Together, you can decide which treatment will work best for you. Your treatment choices may include:  Hormone therapy (estrogen and progesterone).  Non-hormonal medicines.  Treating the individual symptoms with medicine (for example antidepressants for depression).  Herbal medicines that may help specific symptoms.  Counseling by a psychiatrist or psychologist.  Group therapy.  Lifestyle changes including: ? Eating healthy. ? Regular exercise. ? Limiting caffeine and alcohol. ? Stress management and meditation.  No treatment.  Follow these instructions at home:  Take the medicine your health care provider gives you as directed.  Get plenty of sleep and rest.  Exercise regularly.  Eat a diet that contains calcium (good for the bones) and soy products (acts like estrogen hormone).  Avoid alcoholic beverages.  Do not smoke.  If you have hot flashes, dress in layers.  Take supplements, calcium, and vitamin D to strengthen bones.  You can use over-the-counter lubricants or moisturizers for vaginal dryness.  Group therapy is sometimes very helpful.  Acupuncture may be helpful in some cases. Contact a health care provider if:  You are not sure you are in menopause.  You are having menopausal symptoms and need advice and treatment.  You are still having menstrual periods after age 55 years.  You have pain with intercourse.  Menopause is complete (no menstrual period for 12 months) and you develop vaginal bleeding.  You need a referral to a specialist (gynecologist, psychiatrist, or psychologist) for treatment. Get help right   away if:  You have severe depression.  You have excessive vaginal bleeding.  You fell and think you have a  broken bone.  You have pain when you urinate.  You develop leg or chest pain.  You have a fast pounding heart beat (palpitations).  You have severe headaches.  You develop vision problems.  You feel a lump in your breast.  You have abdominal pain or severe indigestion. This information is not intended to replace advice given to you by your health care provider. Make sure you discuss any questions you have with your health care provider. Document Released: 05/28/2003 Document Revised: 08/13/2015 Document Reviewed: 10/04/2012 Elsevier Interactive Patient Education  2017 [place] is the time when your body begins to move into the menopause (no menstrual period for 12 straight months). It is a natural process. Perimenopause can begin 2-8 years before the menopause and usually lasts for 1 year after the menopause. During this time, your ovaries may or may not produce an egg. The ovaries vary in their production of estrogen and progesterone hormones each month. This can cause irregular menstrual periods, difficulty getting pregnant, vaginal bleeding between periods, and uncomfortable symptoms. What are the causes?  Irregular production of the ovarian hormones, estrogen and progesterone, and not ovulating every month. Other causes include:  Tumor of the pituitary gland in the brain.  Medical disease that affects the ovaries.  Radiation treatment.  Chemotherapy.  Unknown causes.  Heavy smoking and excessive alcohol intake can bring on perimenopause sooner.  What are the signs or symptoms?  Hot flashes.  Night sweats.  Irregular menstrual periods.  Decreased sex drive.  Vaginal dryness.  Headaches.  Mood swings.  Depression.  Memory problems.  Irritability.  Tiredness.  Weight gain.  Trouble getting pregnant.  The beginning of losing bone cells (osteoporosis).  The beginning of hardening of the arteries  (atherosclerosis). How is this diagnosed? Your health care provider will make a diagnosis by analyzing your age, menstrual history, and symptoms. He or she will do a physical exam and note any changes in your body, especially your female organs. Female hormone tests may or may not be helpful depending on the amount of female hormones you produce and when you produce them. However, other hormone tests may be helpful to rule out other problems. How is this treated? In some cases, no treatment is needed. The decision on whether treatment is necessary during the perimenopause should be made by you and your health care provider based on how the symptoms are affecting you and your lifestyle. Various treatments are available, such as:  Treating individual symptoms with a specific medicine for that symptom.  Herbal medicines that can help specific symptoms.  Counseling.  Group therapy.  Follow these instructions at home:  Keep track of your menstrual periods (when they occur, how heavy they are, how long between periods, and how long they last) as well as your symptoms and when they started.  Only take over-the-counter or prescription medicines as directed by your health care provider.  Sleep and rest.  Exercise.  Eat a diet that contains calcium (good for your bones) and soy (acts like the estrogen hormone).  Do not smoke.  Avoid alcoholic beverages.  Take vitamin supplements as recommended by your health care provider. Taking vitamin E may help in certain cases.  Take calcium and vitamin D supplements to help prevent bone loss.  Group therapy is sometimes helpful.  Acupuncture may help in some cases. Contact a  health care provider if:  You have questions about any symptoms you are having.  You need a referral to a specialist (gynecologist, psychiatrist, or psychologist). Get help right away if:  You have vaginal bleeding.  Your period lasts longer than 8 days.  Your periods  are recurring sooner than 21 days.  You have bleeding after intercourse.  You have severe depression.  You have pain when you urinate.  You have severe headaches.  You have vision problems. This information is not intended to replace advice given to you by your health care provider. Make sure you discuss any questions you have with your health care provider. Document Released: 04/14/2004 Document Revised: 08/13/2015 Document Reviewed: 10/04/2012 Elsevier Interactive Patient Education  2017 [employer]  

## 2017-04-10 NOTE — Progress Notes (Signed)
Patient ID: Carrie Santos, female   DOB: Aug 17, 1965, 52 y.o.   MRN: [id number] History of Present Illness: Carrie Santos is a 52 year old black female,married,G2P2,  in for well woman gyn exam and pap. PCP is Dr Legrand Rams.   Current Medications, Allergies, Past Medical History, Past Surgical History, Family History and Social History were reviewed in [employer] record.     Review of Systems:  Patient denies any headaches, hearing loss, fatigue, blurred vision, shortness of breath, chest pain, abdominal pain, problems with bowel movements, urination, or intercourse. No joint pain or mood swings. +Hot flashes, periods irregular,weight gain esp around middle  She has know fibroids and can lay on stomach without pain now,she says.  Physical Exam:BP 130/80 (BP Location: Left Arm, Patient Position: Sitting, Cuff Size: Normal)   Pulse 70   Resp 16   Ht 5\' 8"  (1.727 m)   Wt 219 lb (99.3 kg)   BMI 33.30 kg/m  General:  Well developed, well nourished, no acute distress Skin:  Warm and dry Neck:  Midline trachea, normal thyroid, good ROM, no lymphadenopathy Lungs; Clear to auscultation bilaterally Breast:  No dominant palpable mass, retraction, or nipple discharge Cardiovascular: Regular rate and rhythm Abdomen:  Soft, non tender, no hepatosplenomegaly Pelvic:  External genitalia is normal in appearance, no lesions.  The vagina is normal in appearance. Urethra has no lesions or masses. The cervix is bulbous. Pap with HPV performed.  Uterus is enlarged about 14 week size.   No adnexal masses or tenderness noted.Bladder is non tender, no masses felt. Rectal: Good sphincter tone, no polyps, or hemorrhoids felt.  Hemoccult negative. Extremities/musculoskeletal:  No swelling or varicosities noted, no clubbing or cyanosis Psych:  No mood changes, alert and cooperative,seems happy PHQ 2 score 0. Discussed perimenopause, she declines HRT at this time.   Impression: 1. Encounter for  gynecological examination with Papanicolaou smear of cervix   2. Enlarged uterus   3. Anxiety   4. Screening for colorectal cancer   5. Perimenopause       Plan:  Meds ordered this encounter  Medications  . sertraline (ZOLOFT) 50 MG tablet    Sig: TAKE  1 DAILY    Dispense:  90 tablet    Refill:  4    Order Specific Question:   Supervising Provider    Answer:   Florian Buff [[id number]]  Physical in 1 year, pap in 3 if normal Review handouts on perimenopause and menopause Referred to Dr Oneida Alar for colonoscopy  Labs Dr Legrand Rams

## 2017-04-12 LAB — CYTOLOGY - PAP
DIAGNOSIS: NEGATIVE
HPV: NOT DETECTED

## 2017-06-14 ENCOUNTER — Ambulatory Visit: Payer: BC Managed Care – PPO

## 2017-07-13 ENCOUNTER — Other Ambulatory Visit: Payer: Self-pay | Admitting: Adult Health

## 2017-07-25 ENCOUNTER — Other Ambulatory Visit: Payer: Self-pay | Admitting: Internal Medicine

## 2017-07-25 DIAGNOSIS — Z1231 Encounter for screening mammogram for malignant neoplasm of breast: Secondary | ICD-10-CM

## 2017-08-24 ENCOUNTER — Emergency Department (HOSPITAL_COMMUNITY): Payer: BC Managed Care – PPO

## 2017-08-24 ENCOUNTER — Encounter (HOSPITAL_COMMUNITY): Payer: Self-pay | Admitting: *Deleted

## 2017-08-24 ENCOUNTER — Emergency Department (HOSPITAL_COMMUNITY)
Admission: EM | Admit: 2017-08-24 | Discharge: 2017-08-24 | Disposition: A | Discharge status: Home / Self Care | Payer: BC Managed Care – PPO | Attending: Emergency Medicine | Admitting: Emergency Medicine

## 2017-08-24 ENCOUNTER — Other Ambulatory Visit: Payer: Self-pay

## 2017-08-24 DIAGNOSIS — I10 Essential (primary) hypertension: Secondary | ICD-10-CM | POA: Insufficient documentation

## 2017-08-24 DIAGNOSIS — Y998 Other external cause status: Secondary | ICD-10-CM | POA: Diagnosis not present

## 2017-08-24 DIAGNOSIS — S6992XA Unspecified injury of left wrist, hand and finger(s), initial encounter: Secondary | ICD-10-CM | POA: Diagnosis present

## 2017-08-24 DIAGNOSIS — E785 Hyperlipidemia, unspecified: Secondary | ICD-10-CM | POA: Diagnosis not present

## 2017-08-24 DIAGNOSIS — S62647A Nondisplaced fracture of proximal phalanx of left little finger, initial encounter for closed fracture: Secondary | ICD-10-CM | POA: Insufficient documentation

## 2017-08-24 DIAGNOSIS — Y939 Activity, unspecified: Secondary | ICD-10-CM | POA: Insufficient documentation

## 2017-08-24 DIAGNOSIS — Z79899 Other long term (current) drug therapy: Secondary | ICD-10-CM | POA: Insufficient documentation

## 2017-08-24 DIAGNOSIS — Z7982 Long term (current) use of aspirin: Secondary | ICD-10-CM | POA: Diagnosis not present

## 2017-08-24 DIAGNOSIS — Y92410 Unspecified street and highway as the place of occurrence of the external cause: Secondary | ICD-10-CM | POA: Insufficient documentation

## 2017-08-24 MED ORDER — TRAMADOL HCL 50 MG PO TABS: 50.0000 mg | ORAL_TABLET | Freq: Four times a day (QID) | ORAL | 0 refills | Status: DC | PRN
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
## 2017-08-24 NOTE — Discharge Instructions (Addendum)
Call Dr. Brennan Bailey office tomorrow to set up appointment next week for your finger

## 2017-09-12 ENCOUNTER — Ambulatory Visit: Payer: BC Managed Care – PPO

## 2017-10-09 ENCOUNTER — Ambulatory Visit
Admission: RE | Admit: 2017-10-09 | Discharge: 2017-10-09 | Disposition: A | Discharge status: Home / Self Care | Payer: BC Managed Care – PPO | Source: Ambulatory Visit | Attending: Internal Medicine | Admitting: Internal Medicine

## 2017-10-09 DIAGNOSIS — Z1231 Encounter for screening mammogram for malignant neoplasm of breast: Secondary | ICD-10-CM

## 2018-04-25 ENCOUNTER — Other Ambulatory Visit: Payer: Self-pay | Admitting: Adult Health

## 2018-08-19 ENCOUNTER — Other Ambulatory Visit: Payer: Self-pay | Admitting: Adult Health

## 2018-09-18 ENCOUNTER — Other Ambulatory Visit: Payer: Self-pay | Admitting: Internal Medicine

## 2018-09-18 DIAGNOSIS — Z1231 Encounter for screening mammogram for malignant neoplasm of breast: Secondary | ICD-10-CM

## 2018-11-01 ENCOUNTER — Ambulatory Visit
Admission: RE | Admit: 2018-11-01 | Discharge: 2018-11-01 | Disposition: A | Discharge status: Home / Self Care | Payer: BC Managed Care – PPO | Source: Ambulatory Visit | Attending: Internal Medicine | Admitting: Internal Medicine

## 2018-11-01 ENCOUNTER — Other Ambulatory Visit: Payer: Self-pay

## 2018-11-01 DIAGNOSIS — Z1231 Encounter for screening mammogram for malignant neoplasm of breast: Secondary | ICD-10-CM

## 2018-11-06 ENCOUNTER — Other Ambulatory Visit: Payer: BC Managed Care – PPO | Admitting: Adult Health

## 2018-11-30 ENCOUNTER — Telehealth: Payer: Self-pay | Admitting: Family Medicine

## 2018-11-30 NOTE — Telephone Encounter (Signed)
Called patient regarding appointment scheduled in our office encouraged to come alone to the visit if possible, however, a support person, over age 54, may accompany her  to appointment if assistance is needed for safety or care concerns. Otherwise, support persons should remain outside until the visit is complete.  ° °We ask if you have had any exposure to anyone suspected or confirmed of having COVID-19 or if you are experiencing any of the following, to call and reschedule your appointment: fever, cough, shortness of breath, muscle pain, diarrhea, rash, vomiting, abdominal pain, red eye, weakness, bruising, bleeding, joint pain, or a severe headache.  ° °Please know we will ask you these questions or similar questions when you arrive for your appointment and again it’s how we are keeping everyone safe.   ° °Also,to keep you safe, please use the provided hand sanitizer when you enter the office. We are asking everyone in the office to wear a mask to help prevent the spread of °germs. If you have a mask of your own, please wear it to your appointment, if not, we are happy to provide one for you. ° °Thank you for understanding and your cooperation.  ° ° °[facility] Staff ° ° ° °

## 2018-12-03 ENCOUNTER — Ambulatory Visit (INDEPENDENT_AMBULATORY_CARE_PROVIDER_SITE_OTHER): Payer: BC Managed Care – PPO | Admitting: Family Medicine

## 2018-12-03 ENCOUNTER — Other Ambulatory Visit: Payer: Self-pay

## 2018-12-03 ENCOUNTER — Encounter: Payer: Self-pay | Admitting: Family Medicine

## 2018-12-03 VITALS — Ht 67.0 in | Wt 212.0 lb

## 2018-12-03 DIAGNOSIS — N760 Acute vaginitis: Secondary | ICD-10-CM

## 2018-12-03 DIAGNOSIS — Z0000 Encounter for general adult medical examination without abnormal findings: Secondary | ICD-10-CM | POA: Diagnosis not present

## 2018-12-03 DIAGNOSIS — R232 Flushing: Secondary | ICD-10-CM

## 2018-12-03 DIAGNOSIS — Z113 Encounter for screening for infections with a predominantly sexual mode of transmission: Secondary | ICD-10-CM

## 2018-12-03 DIAGNOSIS — B9689 Other specified bacterial agents as the cause of diseases classified elsewhere: Secondary | ICD-10-CM

## 2018-12-03 MED ORDER — VENLAFAXINE HCL ER 37.5 MG PO CP24: 75.0000 mg | ORAL_CAPSULE | Freq: Every day | ORAL | 11 refills | Status: DC

## 2018-12-03 NOTE — Patient Instructions (Signed)
Start effexor this week and decreased sertraline to 25mg  for 1 week. The next week increase the effexor and stop the sertraline.

## 2018-12-03 NOTE — Progress Notes (Signed)
    GYNECOLOGY ANNUAL PREVENTATIVE CARE ENCOUNTER NOTE  Subjective:  Carrie Santos is a 53 y.o. G2P2 female here for a routine annual gynecologic exam.   Current complaints: last period was about 1 year ago. Desires routine STI screening  Chief Complaint  Patient presents with  . Gynecologic Exam    wonders if she's going through menopause; STD screening    Denies abnormal vaginal bleeding, discharge, pelvic pain, problems with intercourse or other gynecologic concerns.    Gynecologic History Patient's last menstrual period was 06/05/2016 (exact date). Contraception: post menopausal status Last Pap: 2019. Results were: normal Last mammogram: 10/2018. Results were: normal  Health Maintenance Due  Topic Date Due  . HIV Screening  06/02/1979  . TETANUS/TDAP  06/02/1983  . COLONOSCOPY  06/02/2014  . INFLUENZA VACCINE  10/20/2018    The following portions of the patient's history were reviewed and updated as appropriate: allergies, current medications, past family history, past medical history, past social history, past surgical history and problem list.  Review of Systems Pertinent items are noted in HPI.   Objective:  Ht 5\' 7"  (1.702 m)   Wt 212 lb (96.2 kg)   LMP 06/05/2016 (Exact Date)   BMI 33.20 kg/m  CONSTITUTIONAL: Well-developed, well-nourished female in no acute distress.  HENT:  Normocephalic, atraumatic, External right and left ear normal. Oropharynx is clear and moist EYES:  No scleral icterus.  NECK: Normal range of motion, supple, no masses.  Normal thyroid.  SKIN: Skin is warm and dry. No rash noted. Not diaphoretic. No erythema. No pallor. NEUROLOGIC: Alert and oriented to person, place, and time. Normal reflexes, muscle tone coordination. No cranial nerve deficit noted. PSYCHIATRIC: Normal mood and affect. Normal behavior. Normal judgment and thought content. CARDIOVASCULAR: Normal heart rate noted, regular rhythm. 2+ distal pulses. RESPIRATORY: Effort and  breath sounds normal, no problems with respiration noted. BREASTS: Symmetric in size. No masses, skin changes, nipple drainage, or lymphadenopathy. ABDOMEN: Soft,  no distention noted.  No tenderness, rebound or guarding.  PELVIC: Normal appearing external genitalia; normal appearing vaginal mucosa and cervix.  No abnormal discharge noted.  Pap not indicated. Enlarged uterine size - 14-15wks- fibroids present and palpated. No other palpable masses, no uterine or adnexal tenderness. MUSCULOSKELETAL: Normal range of motion.    Assessment and Plan:  1) Annual gynecologic examination  - pap not indicated:  Will follow up results of pap smear and manage accordingly. STI screen also ordered today.  Routine preventative health maintenance measures emphasized. Recommended PCP care for colonoscopy referral. Discussed condom use for disease prevention and until confirmation of menopause  1. Screening examination for STD (sexually transmitted disease) - NuSwab Vaginitis Plus (VG+) - Follicle stimulating hormone - LH - RPR - HIV Antibody (routine testing w rflx)  2. Hot flashes Reviewed natural methods to increase estrogen, specifically increasing ingestion of sweet potatoes.  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 Start with 1 tablet daily and then in 1 week increase to two tablets daily  Dispense: 60 capsule; Refill: 11  Please refer to After Visit Summary for other counseling recommendations.   Return in about 1 year (around 12/03/2019) for Yearly wellness exam.  Caren Macadam, MD, MPH, ABFM Attending [place] for [facility]

## 2018-12-04 LAB — LUTEINIZING HORMONE: LH: 28.4 m[IU]/mL

## 2018-12-04 LAB — RPR: RPR Ser Ql: NONREACTIVE

## 2018-12-04 LAB — FOLLICLE STIMULATING HORMONE: FSH: 47.3 m[IU]/mL

## 2018-12-04 LAB — HIV ANTIBODY (ROUTINE TESTING W REFLEX): HIV Screen 4th Generation wRfx: NONREACTIVE

## 2018-12-05 MED ORDER — METRONIDAZOLE 500 MG PO TABS: 500.0000 mg | ORAL_TABLET | Freq: Two times a day (BID) | ORAL | 0 refills | Status: DC

## 2018-12-05 NOTE — Addendum Note (Signed)
Addended by: Caryl Bis on: 12/05/2018 06:05 PM   Modules accepted: Orders

## 2018-12-06 ENCOUNTER — Telehealth: Payer: Self-pay | Admitting: *Deleted

## 2018-12-06 LAB — NUSWAB VAGINITIS PLUS (VG+)
Atopobium vaginae: HIGH Score — AB
Candida albicans, NAA: NEGATIVE
Candida glabrata, NAA: NEGATIVE
Chlamydia trachomatis, NAA: NEGATIVE
Megasphaera 1: HIGH Score — AB
Neisseria gonorrhoeae, NAA: NEGATIVE
Trich vag by NAA: NEGATIVE

## 2018-12-06 LAB — SPECIMEN STATUS REPORT

## 2018-12-06 NOTE — Telephone Encounter (Signed)
Patient informed per Dr Ernestina Patches, [place] indicate she has gone through menopause.  Evidence of BV infection and rx for metronidazole sent to pharmacy. STD testing negative.  Pt verbalized understanding .

## 2018-12-07 ENCOUNTER — Telehealth: Payer: Self-pay | Admitting: *Deleted

## 2018-12-07 NOTE — Telephone Encounter (Signed)
Patient has some questions regarding results and the prescription that was sent to her pharmacy.

## 2018-12-07 NOTE — Telephone Encounter (Signed)
Pt aware of results and voiced understanding. Advised to pick up med from pharmacy. Pt voiced understanding. [place]

## 2019-01-05 ENCOUNTER — Other Ambulatory Visit: Payer: Self-pay | Admitting: Adult Health

## 2019-02-25 ENCOUNTER — Other Ambulatory Visit: Payer: Self-pay

## 2019-02-25 DIAGNOSIS — Z20822 Contact with and (suspected) exposure to covid-19: Secondary | ICD-10-CM

## 2019-02-27 LAB — NOVEL CORONAVIRUS, NAA: SARS-CoV-2, NAA: NOT DETECTED

## 2019-05-21 ENCOUNTER — Ambulatory Visit: Payer: BC Managed Care – PPO | Attending: Internal Medicine

## 2019-05-21 ENCOUNTER — Other Ambulatory Visit: Payer: Self-pay

## 2019-05-21 DIAGNOSIS — Z23 Encounter for immunization: Secondary | ICD-10-CM

## 2019-05-21 NOTE — Progress Notes (Signed)
   Covid-19 Vaccination Clinic  Name:  Carrie Santos    MRN: [id number] DOB: January 25, 1966  05/21/2019  [name] was observed post Covid-19 immunization for 15 minutes without incident. She was provided with Vaccine Information Sheet and instruction to access the V-Safe system.   [name] was instructed to call 911 with any severe reactions post vaccine: Marland Kitchen Difficulty breathing  . Swelling of face and throat  . A fast heartbeat  . A bad rash all over body  . Dizziness and weakness   Immunizations Administered    Name Date Dose VIS Date Route   Pfizer COVID-19 Vaccine 05/21/2019  9:25 AM 0.3 mL 03/01/2019 Intramuscular   Manufacturer: [place]   NDC: [id number]       Covid-19 Vaccination Clinic  Name:  Carrie Santos    MRN: [id number] DOB: 05-Dec-1965  05/21/2019  [name] was observed post Covid-19 immunization for 15 minutes without incident. She was provided with Vaccine Information Sheet and instruction to access the V-Safe system.   [name] was instructed to call 911 with any severe reactions post vaccine: Marland Kitchen Difficulty breathing  . Swelling of face and throat  . A fast heartbeat  . A bad rash all over body  . Dizziness and weakness   Immunizations Administered    Name Date Dose VIS Date Route   Pfizer COVID-19 Vaccine 05/21/2019  9:25 AM 0.3 mL 03/01/2019 Intramuscular   Manufacturer: [place]   Lot: [id number]   NDC: [id number]

## 2019-05-21 NOTE — Progress Notes (Signed)
Patient received Moderna COVID Vaccine  Lot number [id number].  Pfizer was documented in error.  

## 2019-06-18 ENCOUNTER — Ambulatory Visit: Payer: BC Managed Care – PPO | Attending: Internal Medicine

## 2019-06-18 DIAGNOSIS — Z23 Encounter for immunization: Secondary | ICD-10-CM

## 2019-06-18 NOTE — Progress Notes (Signed)
   Covid-19 Vaccination Clinic  Name:  Carrie Santos    MRN: [id number] DOB: [birth year]  06/18/2019  Carrie Santos was observed post Covid-19 immunization for 15 minutes without incident. She was provided with Vaccine Information Sheet and instruction to access the V-Safe system.   Carrie Santos was instructed to call 911 with any severe reactions post vaccine: Marland Kitchen Difficulty breathing  . Swelling of face and throat  . A fast heartbeat  . A bad rash all over body  . Dizziness and weakness   Immunizations Administered    Name Date Dose VIS Date Route   Moderna COVID-19 Vaccine 06/18/2019  9:23 AM 0.5 mL 02/19/2019 Intramuscular   Manufacturer: Moderna   Lot: [id number]   [id number]

## 2019-06-30 ENCOUNTER — Other Ambulatory Visit: Payer: Self-pay | Admitting: Adult Health

## 2019-10-05 ENCOUNTER — Other Ambulatory Visit: Payer: Self-pay | Admitting: Adult Health

## 2019-10-29 ENCOUNTER — Other Ambulatory Visit: Payer: BC Managed Care – PPO | Admitting: Adult Health

## 2019-12-02 ENCOUNTER — Other Ambulatory Visit: Payer: Self-pay | Admitting: Internal Medicine

## 2019-12-02 DIAGNOSIS — Z1231 Encounter for screening mammogram for malignant neoplasm of breast: Secondary | ICD-10-CM

## 2019-12-13 ENCOUNTER — Ambulatory Visit
Admission: RE | Admit: 2019-12-13 | Discharge: 2019-12-13 | Disposition: A | Discharge status: Home / Self Care | Payer: BC Managed Care – PPO | Source: Ambulatory Visit | Attending: Internal Medicine | Admitting: Internal Medicine

## 2019-12-13 ENCOUNTER — Other Ambulatory Visit: Payer: Self-pay

## 2019-12-13 DIAGNOSIS — Z1231 Encounter for screening mammogram for malignant neoplasm of breast: Secondary | ICD-10-CM

## 2020-04-17 ENCOUNTER — Other Ambulatory Visit: Payer: Self-pay | Admitting: Adult Health

## 2020-08-24 ENCOUNTER — Encounter: Payer: Self-pay | Admitting: Internal Medicine

## 2020-08-24 ENCOUNTER — Ambulatory Visit (INDEPENDENT_AMBULATORY_CARE_PROVIDER_SITE_OTHER): Payer: BC Managed Care – PPO

## 2020-08-24 ENCOUNTER — Other Ambulatory Visit: Payer: Self-pay | Admitting: Internal Medicine

## 2020-08-24 ENCOUNTER — Other Ambulatory Visit: Payer: Self-pay

## 2020-08-24 ENCOUNTER — Ambulatory Visit (INDEPENDENT_AMBULATORY_CARE_PROVIDER_SITE_OTHER): Payer: BC Managed Care – PPO | Admitting: Internal Medicine

## 2020-08-24 VITALS — BP 132/86 | HR 82 | Ht 67.0 in | Wt 224.0 lb

## 2020-08-24 DIAGNOSIS — R000 Tachycardia, unspecified: Secondary | ICD-10-CM

## 2020-08-24 NOTE — Patient Instructions (Signed)
Medication Instructions:  Your physician recommends that you continue on your current medications as directed. Please refer to the Current Medication list given to you today.  *If you need a refill on your cardiac medications before your next appointment, please call your pharmacy*   Lab Work: NONE   If you have labs (blood work) drawn today and your tests are completely normal, you will receive your results only by: Marland Kitchen MyChart Message (if you have MyChart) OR . A paper copy in the mail If you have any lab test that is abnormal or we need to change your treatment, we will call you to review the results.   Testing/Procedures: Bryn [place]- Long Term Monitor Instructions   Your physician has requested you wear your ZIO patch monitor____2__days.   This is a single patch monitor.  [employer] supplies one patch monitor per enrollment.  Additional stickers are not available.   Please do not apply patch if you will be having a Nuclear Stress Test, Echocardiogram, Cardiac CT, MRI, or Chest Xray during the time frame you would be wearing the monitor. The patch cannot be worn during these tests.  You cannot remove and re-apply the ZIO XT patch monitor.   Your ZIO patch monitor will be sent USPS Priority mail from [facility] directly to your home address. The monitor may also be mailed to a PO BOX if home delivery is not available.   It may take 3-5 days to receive your monitor after you have been enrolled.   Once you have received you monitor, please review enclosed instructions.  Your monitor has already been registered assigning a specific monitor serial # to you.   Applying the monitor   Shave hair from upper left chest.   Hold abrader disc by orange tab.  Rub abrader in 40 strokes over left upper chest as indicated in your monitor instructions.   Clean area with 4 enclosed alcohol pads .  Use all pads to assure are is cleaned thoroughly.  Let dry.   Apply patch as indicated in monitor  instructions.  Patch will be place under collarbone on left side of chest with arrow pointing upward.   Rub patch adhesive wings for 2 minutes.Remove white label marked "1".  Remove white label marked "2".  Rub patch adhesive wings for 2 additional minutes.   While looking in a mirror, press and release button in center of patch.  A small green light will flash 3-4 times .  This will be your only indicator the monitor has been turned on.     Do not shower for the first 24 hours.  You may shower after the first 24 hours.   Press button if you feel a symptom. You will hear a small click.  Record Date, Time and Symptom in the Patient Log Book.   When you are ready to remove patch, follow instructions on last 2 pages of Patient Log Book.  Stick patch monitor onto last page of Patient Log Book.   Place Patient Log Book in [place] box.  Use locking tab on box and tape box closed securely.  The [employer] and [employer] has [employer] on it.  Please place in mailbox as soon as possible.  Your physician should have your test results approximately 7 days after the monitor has been mailed back to [facility].   Call [place] at [phone] if you have questions regarding your ZIO XT patch monitor.  Call them immediately if you see  an orange light blinking on your monitor.   If your monitor falls off in less than 4 days contact our Monitor department at [phone].  If your monitor becomes loose or falls off after 4 days call [employer] at [phone] for suggestions on securing your monitor.   Follow-Up: At [facility], you and your health needs are our priority.  As part of our continuing mission to provide you with exceptional heart care, we have created designated Provider Care Teams.  These Care Teams include your primary Cardiologist (physician) and Advanced Practice Providers (APPs -  Physician Assistants and Nurse Practitioners) who all work together to provide you with the  care you need, when you need it.  We recommend signing up for the patient portal called "MyChart".  Sign up information is provided on this After Visit Summary.  MyChart is used to connect with patients for Virtual Visits (Telemedicine).  Patients are able to view lab/test results, encounter notes, upcoming appointments, etc.  Non-urgent messages can be sent to your provider as well.   To learn more about what you can do with MyChart, go to [email or web address].    Your next appointment:    Pending test results   The format for your next appointment:   In Person  Provider:   Dorris Carnes, MD   Other Instructions Thank you for choosing [place]!

## 2020-08-24 NOTE — Progress Notes (Signed)
Cardiology Office Note   Date:  08/24/2020   ID:  Carrie Santos, DOB 11/04/1965, MRN [id number]  PCP:  Rosita Fire, MD  Cardiologist:   Dorris Carnes, MD   Patient presents for evaluation of elevated heart rate    History of Present Illness: Carrie Santos is a 56 y.o. female with no known cardiac problems.  She is followed by Dr. Legrand Rams.  She says for the past few months she has noticed that her watch has alerted her  that her heart rate is elevated.  This occurs mainly in the afternoon.  In the morning she does not happen despite the same level of activiyt    IN AM HRs are 70s to 90s    In PM her rates can be in 100s .    The patient denies dizziness.  She is fairly active, goes to the [facility].  Bikes and walks on the treadmill.  Again no dizziness, no palpitations, no syncope.  No chest tightness.  Patient tries to stay hydrated though she says sometimes her urine is darker yellow.   Diet: Breakfast she will have either eggs with bacon or [place].  Occasional oatmeal.  Lunch: Frozen dinner.  Dinner meat and vegetables.  Drinks 2 cups of coffee in the morning.  Water, orange tea.  Snacks chips, fruit.    Current Meds  Medication Sig  . amLODipine (NORVASC) 5 MG tablet Take 5 mg by mouth daily.  Marland Kitchen aspirin EC 81 MG tablet Take 81 mg by mouth daily.  Marland Kitchen atorvastatin (LIPITOR) 40 MG tablet Take 20 mg by mouth every evening.   . cholecalciferol (VITAMIN D) 1000 UNITS tablet Take 5,000 Units by mouth daily.  . Cyanocobalamin (VITAMIN B12 PO) Take by mouth daily.  Marland Kitchen ibuprofen (ADVIL) 800 MG tablet TAKE 1 TABLET BY MOUTH EVERY 8 HOURS AS NEEDED FOR PAIN  . lisinopril-hydrochlorothiazide (PRINZIDE,ZESTORETIC) 10-12.5 MG tablet Take 1 tablet by mouth daily.   . Multiple Vitamin (MULTIVITAMIN) tablet Take 1 tablet by mouth daily.  . Omega-3 Fatty Acids (OMEGA-3 FISH OIL PO) Take by mouth.     Allergies:   Macrobid [nitrofurantoin monohyd macro]   Past Medical History:  Diagnosis Date  .  Anxiety 09/21/2015  . Depression   . Enlarged uterus 12/02/2014  . Hot flashes 12/02/2014  . Hyperlipidemia   . Hypertension   . RLQ abdominal pain 12/02/2014    Past Surgical History:  Procedure Laterality Date  . KNEE SURGERY Left      Social History:  The patient  reports that she has never smoked. She has never used smokeless tobacco. She reports current alcohol use. She reports that she does not use drugs.   Family History:  The patient's family history includes Depression in her mother; Hyperlipidemia in her brother, brother, brother, brother, brother, and father; Hypertension in her brother, brother, brother, brother, brother, father, and mother.    ROS:  Please see the history of present illness. All other systems are reviewed and  Negative to the above problem except as noted.    PHYSICAL EXAM: VS:  BP 132/86 (BP Location: Left Arm)   Pulse 82   Ht 5\' 7"  (1.702 m)   Wt 224 lb (101.6 kg)   LMP 06/05/2016 (Exact Date)   SpO2 96%   BMI 35.08 kg/m   GEN: Obese 55 year old in no acute distress    HEENT: normal  Neck: no JVD, carotid bruits Cardiac: RRR; no murmurs.  No lower extremity edema  Respiratory:  clear to auscultation bilaterally, normal work of breathing GI: soft, nontender, nondistended, + BS  No hepatomegaly  MS: no deformity Moving all extremities   Skin: warm and dry, no rash Neuro:  Strength and sensation are intact Psych: euthymic mood, full affect   EKG:  EKG is ordered today.  Normal sinus rhythm.  86 bpm.  Low voltage QRS in the anterolateral leads.  T wave inversion lead III   Lipid Panel No results found for: CHOL, TRIG, HDL, CHOLHDL, VLDL, LDLCALC, LDLDIRECT    Wt Readings from Last 3 Encounters:  08/24/20 224 lb (101.6 kg)  12/03/18 212 lb (96.2 kg)  08/24/17 219 lb (99.3 kg)      ASSESSMENT AND PLAN:  1.  Tachycardia.  The patient notices her heart rate is fast on her Apple watch.  Again rates are in the 100s.  Does not sound like an  arrhythmia based on history.  Does not sound hemodynamically destabilizing.  She does not sense the rate increase just notices the values.  She has no dizziness.  Since this is a big concern for her I would go ahead and schedule monitor to evaluate the 48-hour heart rate control (Min, max, pauses)  I have encouraged her to stay hydrated.  Stay active.  2.  Diet discussed diet choices.  She should eliminate saturated fats, minimize salty food intake.  Follow-up based on test results.   Current medicines are reviewed at length with the patient today.  The patient does not have concerns regarding medicines.  Signed, Dorris Carnes, MD  08/24/2020 9:15 AM    [address], [place], [place]  [address] Phone: [phone]; Fax: [phone]

## 2020-09-08 ENCOUNTER — Telehealth: Payer: Self-pay | Admitting: *Deleted

## 2020-09-08 MED ORDER — METOPROLOL SUCCINATE ER 25 MG PO TB24: 25.0000 mg | ORAL_TABLET | Freq: Every day | ORAL | 11 refills | Status: DC

## 2020-09-08 NOTE — Telephone Encounter (Signed)
Pt notified and order placed 

## 2020-09-08 NOTE — Telephone Encounter (Signed)
-----   Message from Fay Records, MD sent at 09/08/2020 10:58 AM EDT ----- Monitor showed sinus rhythm, sinus tachycardia   No arrhythmias Rates 72 to 135  (not extreme)  Average HR 98 bpm Triggered events correlated with sinus rhythm and sinus tachycardia  I would recomm hydration.  Make sure adequately hydrated    There is nothing abnormal with monitor If she feels uncomfortable could try adjusting medicines for BP a bit and use something that slows her heart rate a little   Could add a little low dose toprol XL 25

## 2020-10-16 ENCOUNTER — Other Ambulatory Visit: Payer: Self-pay | Admitting: Adult Health

## 2020-10-20 ENCOUNTER — Encounter: Payer: Self-pay | Admitting: Women's Health

## 2020-10-20 ENCOUNTER — Other Ambulatory Visit (HOSPITAL_COMMUNITY)
Admission: RE | Admit: 2020-10-20 | Discharge: 2020-10-20 | Disposition: A | Discharge status: Home / Self Care | Payer: BC Managed Care – PPO | Source: Ambulatory Visit | Attending: Obstetrics & Gynecology | Admitting: Obstetrics & Gynecology

## 2020-10-20 ENCOUNTER — Ambulatory Visit (INDEPENDENT_AMBULATORY_CARE_PROVIDER_SITE_OTHER): Payer: BC Managed Care – PPO | Admitting: Women's Health

## 2020-10-20 ENCOUNTER — Other Ambulatory Visit: Payer: Self-pay

## 2020-10-20 VITALS — BP 132/87 | HR 96 | Ht 67.0 in | Wt 221.4 lb

## 2020-10-20 DIAGNOSIS — Z01419 Encounter for gynecological examination (general) (routine) without abnormal findings: Secondary | ICD-10-CM

## 2020-10-20 NOTE — Patient Instructions (Signed)
Schedule your mammogram for after 12/12/20

## 2020-10-20 NOTE — Progress Notes (Signed)
WELL-WOMAN EXAMINATION Patient name: Carrie Santos MRN [id number]  Date of birth: 08/11/1965 Chief Complaint:   Gynecologic Exam (Pap/physcial/ last pap 04-10-17 negative)  History of Present Illness:   Carrie Santos is a 55 y.o. G92P2 African-American female being seen today for a routine well-woman exam.  Current complaints: trying to lose belly fat, but difficult. Exercising, eating better. Drinks water, lemonade, green tea.   PCP: Legrand Rams      does not desire labs Patient's last menstrual period was 06/05/2016 (exact date). The current method of family planning is post menopausal status.  Last pap 04/10/17. Results were: NILM w/ HRHPV negative. H/O abnormal pap: yes about 16yr ago Last mammogram: 12/13/19. Results were: normal. Family h/o breast cancer: no Last colonoscopy: Cologuard last year rx'd by Dr. FLegrand Rams plans to do again soon. Results were: normal. Family h/o colorectal cancer: no  Depression screen [facility]/9 10/20/2020 12/03/2018 04/10/2017  Decreased Interest 0 0 0  Down, Depressed, Hopeless 0 1 0  PHQ - 2 Score 0 1 0  Altered sleeping 0 - -  Tired, decreased energy 0 - -  Change in appetite 0 - -  Feeling bad or failure about yourself  0 - -  Trouble concentrating 0 - -  Moving slowly or fidgety/restless 0 - -  Suicidal thoughts 0 - -  PHQ-9 Score 0 - -     GAD 7 : Generalized Anxiety Score 10/20/2020  Nervous, Anxious, on Edge 0  Control/stop worrying 0  Worry too much - different things 0  Trouble relaxing 0  Restless 0  Easily annoyed or irritable 0  Afraid - awful might happen 0  Total GAD 7 Score 0     Review of Systems:   Pertinent items are noted in HPI Denies any headaches, blurred vision, fatigue, shortness of breath, chest pain, abdominal pain, abnormal vaginal discharge/itching/odor/irritation, problems with periods, bowel movements, urination, or intercourse unless otherwise stated above. Pertinent History Reviewed:  Reviewed past medical,surgical,  social and family history.  Reviewed problem list, medications and allergies. Physical Assessment:   Vitals:   10/20/20 1009  BP: 132/87  Pulse: 96  Weight: 221 lb 6.4 oz (100.4 kg)  Height: '5\' 7"'$  (1.702 m)  Body mass index is 34.68 kg/m.        Physical Examination:   General appearance - well appearing, and in no distress  Mental status - alert, oriented to person, place, and time  Psych:  She has a normal mood and affect  Skin - warm and dry, normal color, no suspicious lesions noted  Chest - effort normal, all lung fields clear to auscultation bilaterally  Heart - normal rate and regular rhythm  Neck:  midline trachea, no thyromegaly or nodules  Breasts - breasts appear normal, no suspicious masses, no skin or nipple changes or  axillary nodes  Abdomen - soft, nontender, nondistended, no masses or organomegaly  Pelvic - VULVA: normal appearing vulva with no masses, tenderness or lesions  VAGINA: normal appearing vagina with normal color and discharge, no lesions  CERVIX: normal appearing cervix without discharge or lesions, no CMT  Thin prep pap is done w/ HR HPV cotesting  UTERUS: uterus is felt to be normal size, shape, consistency and nontender   ADNEXA: No adnexal masses or tenderness noted.  Rectal - normal rectal, good sphincter tone, no masses felt, small rectocele. Hemoccult: neg  Extremities:  No swelling or varicosities noted  Chaperone: Latisha Cresenzo    No results found  for this or any previous visit (from the past 24 hour(s)).  Assessment & Plan:  1) Well-Woman Exam  2) Small rectocele> per pt doesn't bother her  3) Stubborn belly fat> discussed can be difficult to lose postmenopausal, keep doing exercises-esp focusing on abdomen, decrease carbs  Labs/procedures today: pap, hemoccult  Mammogram:  schedule after 9/24 , or sooner if problems Colonoscopy:  repeat cologuard this year- Dr. Legrand Rams  No orders of the defined types were placed in this  encounter.   Meds: No orders of the defined types were placed in this encounter.   Follow-up: Return in about 1 year (around 10/20/2021) for Physical.  Bruni, [facility] 10/20/2020 10:43 AM

## 2020-10-22 LAB — CYTOLOGY - PAP
Adequacy: ABSENT
Chlamydia: NEGATIVE
Comment: NEGATIVE
Comment: NEGATIVE
Comment: NORMAL
Diagnosis: NEGATIVE
High risk HPV: NEGATIVE
Neisseria Gonorrhea: NEGATIVE

## 2020-12-09 ENCOUNTER — Other Ambulatory Visit: Payer: Self-pay | Admitting: Internal Medicine

## 2020-12-09 DIAGNOSIS — Z1231 Encounter for screening mammogram for malignant neoplasm of breast: Secondary | ICD-10-CM

## 2021-01-14 ENCOUNTER — Ambulatory Visit
Admission: RE | Admit: 2021-01-14 | Discharge: 2021-01-14 | Disposition: A | Discharge status: Home / Self Care | Payer: BC Managed Care – PPO | Source: Ambulatory Visit | Attending: Internal Medicine | Admitting: Internal Medicine

## 2021-01-14 ENCOUNTER — Other Ambulatory Visit: Payer: Self-pay

## 2021-01-14 DIAGNOSIS — Z1231 Encounter for screening mammogram for malignant neoplasm of breast: Secondary | ICD-10-CM

## 2021-02-27 ENCOUNTER — Other Ambulatory Visit: Payer: Self-pay | Admitting: Adult Health

## 2021-05-17 ENCOUNTER — Ambulatory Visit: Payer: BC Managed Care – PPO | Admitting: Nurse Practitioner

## 2021-09-13 ENCOUNTER — Other Ambulatory Visit: Payer: Self-pay | Admitting: Adult Health

## 2021-10-26 ENCOUNTER — Ambulatory Visit: Payer: BC Managed Care – PPO | Admitting: Obstetrics & Gynecology

## 2021-11-23 ENCOUNTER — Ambulatory Visit: Payer: Self-pay | Admitting: Family Medicine

## 2021-12-13 ENCOUNTER — Ambulatory Visit (INDEPENDENT_AMBULATORY_CARE_PROVIDER_SITE_OTHER): Payer: BC Managed Care – PPO | Admitting: Adult Health

## 2021-12-13 ENCOUNTER — Encounter: Payer: Self-pay | Admitting: Internal Medicine

## 2021-12-13 ENCOUNTER — Ambulatory Visit: Payer: BC Managed Care – PPO | Admitting: Internal Medicine

## 2021-12-13 ENCOUNTER — Encounter: Payer: Self-pay | Admitting: Adult Health

## 2021-12-13 VITALS — BP 126/78 | HR 108 | Ht 67.0 in | Wt 216.4 lb

## 2021-12-13 VITALS — BP 123/83 | HR 103 | Ht 66.0 in | Wt 220.0 lb

## 2021-12-13 DIAGNOSIS — N852 Hypertrophy of uterus: Secondary | ICD-10-CM | POA: Diagnosis not present

## 2021-12-13 DIAGNOSIS — Z23 Encounter for immunization: Secondary | ICD-10-CM | POA: Diagnosis not present

## 2021-12-13 DIAGNOSIS — R1031 Right lower quadrant pain: Secondary | ICD-10-CM

## 2021-12-13 DIAGNOSIS — R252 Cramp and spasm: Secondary | ICD-10-CM

## 2021-12-13 DIAGNOSIS — Z1211 Encounter for screening for malignant neoplasm of colon: Secondary | ICD-10-CM | POA: Diagnosis not present

## 2021-12-13 DIAGNOSIS — Z0000 Encounter for general adult medical examination without abnormal findings: Secondary | ICD-10-CM

## 2021-12-13 DIAGNOSIS — D219 Benign neoplasm of connective and other soft tissue, unspecified: Secondary | ICD-10-CM | POA: Insufficient documentation

## 2021-12-13 DIAGNOSIS — E782 Mixed hyperlipidemia: Secondary | ICD-10-CM

## 2021-12-13 DIAGNOSIS — E66812 Obesity, class 2: Secondary | ICD-10-CM | POA: Insufficient documentation

## 2021-12-13 DIAGNOSIS — I10 Essential (primary) hypertension: Secondary | ICD-10-CM | POA: Insufficient documentation

## 2021-12-13 DIAGNOSIS — E785 Hyperlipidemia, unspecified: Secondary | ICD-10-CM | POA: Insufficient documentation

## 2021-12-13 DIAGNOSIS — Z01419 Encounter for gynecological examination (general) (routine) without abnormal findings: Secondary | ICD-10-CM

## 2021-12-13 DIAGNOSIS — E669 Obesity, unspecified: Secondary | ICD-10-CM

## 2021-12-13 DIAGNOSIS — Z1212 Encounter for screening for malignant neoplasm of rectum: Secondary | ICD-10-CM

## 2021-12-13 LAB — HEMOCCULT GUIAC POC 1CARD (OFFICE): Fecal Occult Blood, POC: NEGATIVE

## 2021-12-13 NOTE — Assessment & Plan Note (Signed)
BMI 33.8.  Labs from July reviewed.  Total cholesterol and LDL are both above goal.  HbA1c 5.6, just below the prediabetic range.  We discussed appropriate lifestyle modifications aimed at weight loss.  These include incorporating more fruits, vegetables, nuts, and lean protein into her diet.  She needs to limit fatty/fried foods.  We also discussed the national recommendation for at least 150 total minutes of moderate intensity exercise on a weekly basis.  She is also interested in starting a medication for weight loss and specifically mentions Wegovy. -Follow-up in 4 weeks to discuss initiation of GLP-1 therapy for weight loss.

## 2021-12-13 NOTE — Patient Instructions (Signed)
It was a pleasure to see you today.  Thank you for giving [place] the opportunity to be involved in your care.  Below is a brief recap of your visit and next steps.  We will plan to see you again in 4 weeks.  Summary We will check labs today You will receive your flu shot I have ordered Cologuard for colon cancer screening  Next steps Follow up in 4 weeks to discuss weight loss and cholesterol

## 2021-12-13 NOTE — Assessment & Plan Note (Signed)
Presenting today to establish care. -Labs from 09/20/2021 reviewed -Flu shot administered today -Tdap outstanding, but need outside records to confirm -Cologuard ordered today for colorectal cancer screening

## 2021-12-13 NOTE — Progress Notes (Signed)
Patient ID: Carrie Santos, female   DOB: 06-Aug-1965, 56 y.o.   MRN: [id number] History of Present Illness: Carrie Santos is a 56 year old black female, divorced, PM in for well woman gyn exam. She saw Dr Doren Custard today to establish care and has cramping left calf. She is having RLQ pain at times, has known fibroid.  Last pap was 10/20/20 negative HPV and malignancy.  PCP is Dr Doren Custard.  Current Medications, Allergies, Past Medical History, Past Surgical History, Family History and Social History were reviewed in [employer] record.     Review of Systems: Patient denies any headaches, hearing loss, fatigue, blurred vision, shortness of breath, chest pain, problems with bowel movements, urination, or intercourse. No joint pain or mood swings.  See HPI for positives.   Physical Exam:BP 123/83 (BP Location: Left Arm, Patient Position: Sitting, Cuff Size: Normal)   Pulse (!) 103   Ht '5\' 6"'$  (1.676 m)   Wt 220 lb (99.8 kg)   LMP 06/05/2016 (Exact Date)   BMI 35.51 kg/m   General:  Well developed, well nourished, no acute distress Skin:  Warm and dry Neck:  Midline trachea, normal thyroid, good ROM, no lymphadenopathy Lungs; Clear to auscultation bilaterally Breast:  No dominant palpable mass, retraction, or nipple discharge Cardiovascular: Regular rate and rhythm Abdomen:  Soft, non tender, no hepatosplenomegaly Pelvic:  External genitalia is normal in appearance, no lesions.  The vagina is pink. Urethra has no lesions or masses. The cervix is bulbous,and smooth.  Uterus is enlarged, about 16 week size and fundal fibroid felt.   No adnexal masses or tenderness noted.Bladder is non tender, no masses felt. Rectal: Good sphincter tone, no polyps, or hemorrhoids felt.  Hemoccult negative. Extremities/musculoskeletal:  No swelling or varicosities noted, no clubbing or cyanosis Psych:  No mood changes, alert and cooperative,seems happy AA is 1 Fall risk is low    12/13/2021    2:42  PM 12/13/2021   10:08 AM 10/20/2020   10:13 AM  Depression screen PHQ 2/9  Decreased Interest 0 0 0  Down, Depressed, Hopeless 1 0 0  PHQ - 2 Score 1 0 0  Altered sleeping 1  0  Tired, decreased energy 1  0  Change in appetite 0  0  Feeling bad or failure about yourself  0  0  Trouble concentrating 0  0  Moving slowly or fidgety/restless 0  0  Suicidal thoughts 0  0  PHQ-9 Score 3  0       12/13/2021    2:42 PM 10/20/2020   10:13 AM  GAD 7 : Generalized Anxiety Score  Nervous, Anxious, on Edge 0 0  Control/stop worrying 0 0  Worry too much - different things 0 0  Trouble relaxing 1 0  Restless 0 0  Easily annoyed or irritable 0 0  Afraid - awful might happen 0 0  Total GAD 7 Score 1 0    Upstream - 12/13/21 1445       Pregnancy Intention Screening   Does the patient want to become pregnant in the next year? N/A    Does the patient's partner want to become pregnant in the next year? N/A    Would the patient like to discuss contraceptive options today? N/A      Contraception Wrap Up   Current Method No Method - Other Reason   postmenopausal   End Method No Method - Other Reason   postmenopausal   Contraception Counseling Provided No  Examination chaperoned by Levy Pupa LPN    Impression and Plan: 1. Encounter for well woman exam with routine gynecological exam Physical in 1 year Pap in 2023 Mammogram was negative 01/14/21 Labs with PCP  2. Encounter for screening fecal occult blood testing Hemoccult was negative  - POCT occult blood stool  3. Enlarged uterus Will get pelvic US  - US PELVIC COMPLETE WITH TRANSVAGINAL; Future  4. RLQ abdominal pain Will get [place] to assess uterus and ovaries in office  - US PELVIC COMPLETE WITH TRANSVAGINAL; Future  5. Fibroids Pelvis US scheduled for 01/21/22 to assess uterus and ovaries in office, will talk when results back  - US PELVIC COMPLETE WITH TRANSVAGINAL; Future

## 2021-12-13 NOTE — Assessment & Plan Note (Signed)
Currently prescribed atorvastatin.  Last lipid panel from July reviewed.  Total cholesterol 222, LDL 152.  10-year ASCVD risk 6.1%. -No changes today.  Continue atorvastatin.  We discussed limiting fatty/fried foods -Further discuss at follow-up in 4 weeks

## 2021-12-13 NOTE — Progress Notes (Signed)
New Patient Office Visit  Subjective    Patient ID: Carrie Santos, female    DOB: 1965/07/07  Age: 56 y.o. MRN: [id number]  CC:  Chief Complaint  Patient presents with   Establish Care    Muscle spasms/cramps. On 12/07/21 had bad cramp in left leg and couldn't hardly walk or move leg.     HPI Carrie Santos presents to establish care.  She is a 56 year old woman with a past medical history significant for hypertension, HLD, uterine fibroids, and obesity.  She was most recently followed by Carrie Heritage, NP with [facility] at [facility].  Today Carrie Santos states that she feels fairly well but is concerned about recent cramping in her leg and neck.  She states that one week ago she walked outside to get something from her car and upon returning inside the air conditioned building she had severe cramping in her left leg and thigh to the point she was unable to walk and had to have someone assist her back to her seat.  She remains sore from this event.  She had a similar episode several weeks ago where she was outside working and returned inside and developed a cramp in her neck.  Carrie Santos is concerned that one of her blood pressure medicines is causing these episodes, so she has not been taking amlodipine.  She otherwise denies acute concerns or symptoms.  Acute concerns, chronic medical conditions, and outstanding preventative healthcare maintenance items discussed today are individually addressed in A/P below.  Outpatient Encounter Medications as of 12/13/2021  Medication Sig   amLODipine (NORVASC) 5 MG tablet Take 5 mg by mouth daily.   atorvastatin (LIPITOR) 40 MG tablet Take 20 mg by mouth every evening.    cholecalciferol (VITAMIN D) 1000 UNITS tablet Take 5,000 Units by mouth daily.   Cyanocobalamin (VITAMIN B12 PO) Take by mouth daily.   fluticasone (FLONASE) 50 MCG/ACT nasal spray two sprays by Both Nostrils route daily.   ibuprofen (ADVIL) 800 MG tablet TAKE 1 TABLET BY MOUTH  EVERY 8 HOURS AS NEEDED FOR PAIN   lisinopril-hydrochlorothiazide (PRINZIDE,ZESTORETIC) 10-12.5 MG tablet Take 1 tablet by mouth daily.    Multiple Vitamin (MULTIVITAMIN) tablet Take 1 tablet by mouth daily.   Omega-3 Fatty Acids (OMEGA-3 FISH OIL PO) Take by mouth.   sennosides-docusate sodium (SENOKOT-S) 8.6-50 MG tablet Take 1 tablet by mouth daily.   [DISCONTINUED] aspirin EC 81 MG tablet Take 81 mg by mouth daily.   [DISCONTINUED] metoprolol succinate (TOPROL XL) 25 MG 24 hr tablet Take 1 tablet (25 mg total) by mouth daily. (Patient not taking: Reported on 10/20/2020)   No facility-administered encounter medications on file as of 12/13/2021.    Past Medical History:  Diagnosis Date   Anxiety 09/21/2015   Depression    Enlarged uterus 12/02/2014   Hot flashes 12/02/2014   Hyperlipidemia    Hypertension    RLQ abdominal pain 12/02/2014    Past Surgical History:  Procedure Laterality Date   KNEE SURGERY Left     Family History  Problem Relation Age of Onset   Depression Mother    Hypertension Mother    Hypertension Father    Hyperlipidemia Father    Hyperlipidemia Brother    Hypertension Brother    Hyperlipidemia Brother    Hypertension Brother    Hyperlipidemia Brother    Hypertension Brother    Hyperlipidemia Brother    Hypertension Brother    Hyperlipidemia Brother    Hypertension  Brother     Social History   Socioeconomic History   Marital status: Divorced    Spouse name: Not on file   Number of children: Not on file   Years of education: Not on file   Highest education level: Not on file  Occupational History   Not on file  Tobacco Use   Smoking status: Never   Smokeless tobacco: Never  Vaping Use   Vaping Use: Never used  Substance and Sexual Activity   Alcohol use: Yes    Comment: occ   Drug use: No   Sexual activity: Yes    Birth control/protection: None  Other Topics Concern   Not on file  Social History Narrative   Not on file   Social  Determinants of Health   Financial Resource Strain: Low Risk  (10/20/2020)   Overall Financial Resource Strain (CARDIA)    Difficulty of Paying Living Expenses: Not hard at all  Food Insecurity: No Food Insecurity (10/20/2020)   Hunger Vital Sign    Worried About Running Out of Food in the Last Year: Never true    [place] in the Last Year: Never true  Transportation Needs: No Transportation Needs (10/20/2020)   PRAPARE - Hydrologist (Medical): No    Lack of Transportation (Non-Medical): No  Physical Activity: Sufficiently Active (10/20/2020)   Exercise Vital Sign    Days of Exercise per Week: 4 days    Minutes of Exercise per Session: 60 min  Stress: No Stress Concern Present (10/20/2020)   [place]    Feeling of Stress : Not at all  Social Connections: Moderately Integrated (10/20/2020)   Social Connection and Isolation Panel [NHANES]    Frequency of Communication with Friends and Family: More than three times a week    Frequency of Social Gatherings with Friends and Family: Twice a week    Attends Religious Services: More than 4 times per year    Active Member of [employer] or Organizations: Yes    Attends Music therapist: More than 4 times per year    Marital Status: Divorced  Intimate Partner Violence: Not At Risk (10/20/2020)   Humiliation, Afraid, Rape, and Kick questionnaire    Fear of Current or Ex-Partner: No    Emotionally Abused: No    Physically Abused: No    Sexually Abused: No    Review of Systems  Constitutional:  Negative for chills and fever.  HENT:  Negative for sore throat.   Respiratory:  Negative for cough and shortness of breath.   Cardiovascular:  Negative for chest pain, palpitations and leg swelling.  Gastrointestinal:  Negative for abdominal pain, blood in stool, constipation, diarrhea, nausea and vomiting.  Genitourinary:  Negative for dysuria and  hematuria.  Musculoskeletal:        Neck and left leg cramping  Skin:  Negative for itching and rash.  Neurological:  Negative for dizziness and headaches.  Psychiatric/Behavioral:  Negative for depression and suicidal ideas.         Objective    BP 126/78   Pulse (!) 108   Ht '5\' 7"'$  (1.702 m)   Wt 216 lb 6.4 oz (98.2 kg)   LMP 06/05/2016 (Exact Date)   SpO2 93%   BMI 33.89 kg/m   Physical Exam Constitutional:      General: She is not in acute distress.    Appearance: Normal appearance. She is  obese. She is not toxic-appearing.  HENT:     Head: Normocephalic and atraumatic.     Right Ear: External ear normal.     Left Ear: External ear normal.     Nose: Nose normal. No congestion or rhinorrhea.     Mouth/Throat:     Mouth: Mucous membranes are moist.     Pharynx: Oropharynx is clear. No oropharyngeal exudate or posterior oropharyngeal erythema.  Eyes:     General: No scleral icterus.    Extraocular Movements: Extraocular movements intact.     Conjunctiva/sclera: Conjunctivae normal.     Pupils: Pupils are equal, round, and reactive to light.  Neck:     Comments: There is tenderness palpation of the left trapezius Cardiovascular:     Rate and Rhythm: Normal rate and regular rhythm.     Pulses: Normal pulses.     Heart sounds: Normal heart sounds. No murmur heard.    No friction rub. No gallop.  Pulmonary:     Effort: Pulmonary effort is normal.     Breath sounds: Normal breath sounds. No wheezing, rhonchi or rales.  Abdominal:     General: Abdomen is flat. Bowel sounds are normal. There is no distension.     Palpations: Abdomen is soft.     Tenderness: There is no abdominal tenderness.  Musculoskeletal:        General: Tenderness present. Normal range of motion.     Cervical back: Normal range of motion. Tenderness present.     Comments: There is tenderness palpation over the left calf and thigh.  The left leg is not swollen compared to the right.  There is no  appreciable increased warmth, erythema, or induration.  ROM at the right knee is intact.  Negative Homans' sign in the left leg.  Skin:    General: Skin is warm and dry.     Capillary Refill: Capillary refill takes less than 2 seconds.     Coloration: Skin is not jaundiced.     Findings: No erythema.  Neurological:     General: No focal deficit present.     Mental Status: She is alert and oriented to person, place, and time.     Motor: No weakness.  Psychiatric:        Mood and Affect: Mood normal.        Behavior: Behavior normal.     Assessment & Plan:   Problem List Items Addressed This Visit       Essential hypertension    BP 126/78 today.  She is currently prescribed amlodipine 5 mg daily and lisinopril-HCTZ 10-12.5 mg daily.  She has not been taking amlodipine recently due to concern for cramping.  I reviewed with Carrie Santos that amlodipine is likely not causing her cramping.  We discussed the importance of proper hydration. -Resume amlodipine.  No changes today.      Hyperlipidemia    Currently prescribed atorvastatin.  Last lipid panel from July reviewed.  Total cholesterol 222, LDL 152.  10-year ASCVD risk 6.1%. -No changes today.  Continue atorvastatin.  We discussed limiting fatty/fried foods -Further discuss at follow-up in 4 weeks      Obesity (BMI 30.0-34.9)    BMI 33.8.  Labs from July reviewed.  Total cholesterol and LDL are both above goal.  HbA1c 5.6, just below the prediabetic range.  We discussed appropriate lifestyle modifications aimed at weight loss.  These include incorporating more fruits, vegetables, nuts, and lean protein into her diet.  She  needs to limit fatty/fried foods.  We also discussed the national recommendation for at least 150 total minutes of moderate intensity exercise on a weekly basis.  She is also interested in starting a medication for weight loss and specifically mentions Wegovy. -Follow-up in 4 weeks to discuss initiation of GLP-1 therapy  for weight loss.      Preventative health care    Presenting today to establish care. -Labs from 09/20/2021 reviewed -Flu shot administered today -Tdap outstanding, but need outside records to confirm -Cologuard ordered today for colorectal cancer screening      Leg cramping    She reports residual soreness from a leg cramp 1 week ago.  She is concerned that this is caused by amlodipine and she has stopped taking this medication.  There is mild tenderness palpation over the left calf and thigh on exam.  The left leg is not swollen compared to the right and there is no erythema or induration present.  Very low concern for DVT. -Repeat BMP today -Reviewed the importance of hydration and regular stretching      Return in about 4 weeks (around 01/10/2022).   Johnette Abraham, MD

## 2021-12-13 NOTE — Assessment & Plan Note (Signed)
She reports residual soreness from a leg cramp 1 week ago.  She is concerned that this is caused by amlodipine and she has stopped taking this medication.  There is mild tenderness palpation over the left calf and thigh on exam.  The left leg is not swollen compared to the right and there is no erythema or induration present.  Very low concern for DVT. -Repeat BMP today -Reviewed the importance of hydration and regular stretching

## 2021-12-13 NOTE — Assessment & Plan Note (Signed)
BP 126/78 today.  She is currently prescribed amlodipine 5 mg daily and lisinopril-HCTZ 10-12.5 mg daily.  She has not been taking amlodipine recently due to concern for cramping.  I reviewed with Carrie Santos that amlodipine is likely not causing her cramping.  We discussed the importance of proper hydration. -Resume amlodipine.  No changes today.

## 2021-12-14 LAB — BASIC METABOLIC PANEL
BUN/Creatinine Ratio: 18 (ref 9–23)
BUN: 16 mg/dL (ref 6–24)
CO2: 22 mmol/L (ref 20–29)
Calcium: 9.7 mg/dL (ref 8.7–10.2)
Chloride: 101 mmol/L (ref 96–106)
Creatinine, Ser: 0.88 mg/dL (ref 0.57–1.00)
Glucose: 74 mg/dL (ref 70–99)
Potassium: 4.1 mmol/L (ref 3.5–5.2)
Sodium: 141 mmol/L (ref 134–144)
eGFR: 77 mL/min/{1.73_m2} (ref >59)

## 2021-12-14 LAB — HCV AB W REFLEX TO QUANT PCR: HCV Ab: NONREACTIVE

## 2021-12-14 LAB — HCV INTERPRETATION

## 2021-12-15 ENCOUNTER — Ambulatory Visit: Payer: BC Managed Care – PPO | Admitting: Obstetrics & Gynecology

## 2021-12-20 ENCOUNTER — Telehealth: Payer: Self-pay | Admitting: Internal Medicine

## 2021-12-20 NOTE — Telephone Encounter (Signed)
Pt called stating she is still having pain in her left leg and it does have a swollen spot by the knee. She wants to know what to do? Asked if nurse could please give her a call?

## 2021-12-21 ENCOUNTER — Encounter: Payer: Self-pay | Admitting: Orthopedic Surgery

## 2021-12-21 ENCOUNTER — Ambulatory Visit (INDEPENDENT_AMBULATORY_CARE_PROVIDER_SITE_OTHER): Payer: BC Managed Care – PPO

## 2021-12-21 ENCOUNTER — Ambulatory Visit: Payer: BC Managed Care – PPO | Admitting: Orthopedic Surgery

## 2021-12-21 ENCOUNTER — Encounter: Payer: Self-pay | Admitting: Internal Medicine

## 2021-12-21 ENCOUNTER — Ambulatory Visit (INDEPENDENT_AMBULATORY_CARE_PROVIDER_SITE_OTHER): Payer: BC Managed Care – PPO | Admitting: Internal Medicine

## 2021-12-21 VITALS — BP 138/88 | HR 103 | Ht 66.0 in | Wt 218.0 lb

## 2021-12-21 VITALS — BP 152/102 | HR 107 | Ht 66.0 in | Wt 220.0 lb

## 2021-12-21 DIAGNOSIS — M25562 Pain in left knee: Secondary | ICD-10-CM

## 2021-12-21 DIAGNOSIS — M1712 Unilateral primary osteoarthritis, left knee: Secondary | ICD-10-CM | POA: Insufficient documentation

## 2021-12-21 DIAGNOSIS — M25462 Effusion, left knee: Secondary | ICD-10-CM | POA: Diagnosis not present

## 2021-12-21 NOTE — Assessment & Plan Note (Signed)
Presenting today for evaluation of left knee pain and effusion that has been present x1 week.  She has tenderness palpation along the medial joint line but otherwise seems structurally intact on exam.  No recent x-rays available for review.  She has been taking Tylenol arthritis strength with minimal pain relief. -I have ordered x-rays of the left knee today and placed a referral to orthopedic surgery for evaluation.  She has previously scheduled follow-up with me arranged for 01/21/2022, but can return to care before this date if needed.

## 2021-12-21 NOTE — Progress Notes (Signed)
   Acute Office Visit  Subjective:     Patient ID: Carrie Santos, female    DOB: 1965/12/06, 56 y.o.   MRN: [id number]  Chief Complaint  Patient presents with   Leg Swelling    Left knee swelling started 12/16/2021. Pain started 12/13/2021.   Carrie Santos presents today for an acute visit for evaluation of left knee pain and swelling.  She reports onset of pain and swelling of the left knee on 9/26.  This was preceded by cramping in her left calf to the point that she required assistance to get back to her car after visiting a friend's home.  She did not have a left knee effusion when she saw me on 9/25, but did endorse cramping in her left calf.  We checked electrolytes at that time, which were within normal limits.  Carrie Santos denies experiencing any type of fall or injury.  She does not recall any pop, click, or tear at the onset of pain.  She is able to bear weight on the left leg, but states that her pain has not improved.  Her pain is mostly located over the lateral aspect of the left knee.  She has tried taking Tylenol arthritis strength for pain with minimal relief.  Of note, she has previously undergone left knee arthroscopy with Dr. Aline Brochure in 2006.  There have been no interval issues with the left knee.  Review of Systems  Musculoskeletal:        Left knee pain and effusion      Objective:    BP 138/88 (BP Location: Right Arm, Cuff Size: Normal)   Pulse (!) 103   Ht '5\' 6"'$  (1.676 m)   Wt 218 lb (98.9 kg)   LMP 06/05/2016 (Exact Date)   SpO2 97%   BMI 35.19 kg/m   Physical Exam Musculoskeletal:     Left knee: Swelling, effusion and crepitus present. Tenderness present over the medial joint line.     Comments: ROM of the left knee is intact.  There is mild effusion present on exam without significant increased warmth compared to the right knee.  No increased pain with valgus/varus stress.  Negative Lachman's, anterior drawer, and lever test.  Negative posterior drawer.  Negative  McMurray's.           Assessment & Plan:   Problem List Items Addressed This Visit     Acute pain of left knee - Primary    Presenting today for evaluation of left knee pain and effusion that has been present x1 week.  She has tenderness palpation along the medial joint line but otherwise seems structurally intact on exam.  No recent x-rays available for review.  She has been taking Tylenol arthritis strength with minimal pain relief. -I have ordered x-rays of the left knee today and placed a referral to orthopedic surgery for evaluation.  She has previously scheduled follow-up with me arranged for 01/21/2022, but can return to care before this date if needed.      Relevant Orders   DG Knee Complete 4 Views Left   Ambulatory referral to Orthopedic Surgery   Return if symptoms worsen or fail to improve.  Johnette Abraham, MD

## 2021-12-21 NOTE — Progress Notes (Signed)
New Patient Visit  Assessment: Carrie Santos is a 56 y.o. female with the following: 1. Acute pain of left knee  Plan: Carrie Santos has pain in her left knee.  Radiographs demonstrates mild varus alignment overall, with near complete loss of joint space within the medial compartment.  It is possible that she has sustained an arthritic flare, or potentially injured the meniscus in the lateral compartment.  We discussed multiple treatment options including medications, topical treatments, patches, bracing, physical therapy and injection.  She will continue with her medicines as needed.  She will consider topical treatments, as well as a sleeve for her left knee.  If she continues to struggle, I would recommend physical therapy, and a steroid injection.  Follow-up: Return if symptoms worsen or fail to improve.  Subjective:  Chief Complaint  Patient presents with   Knee Pain    Left/ has been painful for about a week     History of Present Illness: Carrie Santos is a 56 y.o. female who has been referred by Sharolyn Douglas, MD for evaluation of left knee pain.  She states approximately 1 week ago, she started to have pain in the left knee.  Started with cramping in her left calf.  She required help getting to her car.  Since then, the left knee has started to swell.  She is complaining of pain in the lateral knee.  She is taking Tylenol arthritis for pain, with some improvement in her symptoms.  She does have a knee sleeve, but she has not started wearing this yet.  She does have a history of left knee arthroscopy, completed by Dr. Aline Brochure in 2006.  No issues since then.   Review of Systems: No fevers or chills No numbness or tingling No chest pain No shortness of breath No bowel or bladder dysfunction No GI distress No headaches   Medical History:  Past Medical History:  Diagnosis Date   Anxiety 09/21/2015   Depression    Enlarged uterus 12/02/2014   Hot flashes 12/02/2014    Hyperlipidemia    Hypertension    RLQ abdominal pain 12/02/2014    Past Surgical History:  Procedure Laterality Date   KNEE SURGERY Left     Family History  Problem Relation Age of Onset   Depression Mother    Hypertension Mother    Hypertension Father    Hyperlipidemia Father    Hyperlipidemia Brother    Hypertension Brother    Hyperlipidemia Brother    Hypertension Brother    Hyperlipidemia Brother    Hypertension Brother    Hyperlipidemia Brother    Hypertension Brother    Hyperlipidemia Brother    Hypertension Brother    Social History   Tobacco Use   Smoking status: Never   Smokeless tobacco: Never  Vaping Use   Vaping Use: Never used  Substance Use Topics   Alcohol use: Yes    Comment: occ   Drug use: No    Allergies  Allergen Reactions   Macrobid [Nitrofurantoin Monohyd Macro] Shortness Of Breath    Current Meds  Medication Sig   amLODipine (NORVASC) 5 MG tablet Take 5 mg by mouth daily.   atorvastatin (LIPITOR) 40 MG tablet Take 20 mg by mouth every evening.    cholecalciferol (VITAMIN D) 1000 UNITS tablet Take 5,000 Units by mouth daily.   Cyanocobalamin (VITAMIN B12 PO) Take by mouth daily.   fluticasone (FLONASE) 50 MCG/ACT nasal spray two sprays by Both Nostrils route daily.  ibuprofen (ADVIL) 800 MG tablet TAKE 1 TABLET BY MOUTH EVERY 8 HOURS AS NEEDED FOR PAIN   lisinopril-hydrochlorothiazide (PRINZIDE,ZESTORETIC) 10-12.5 MG tablet Take 1 tablet by mouth daily.    Multiple Vitamin (MULTIVITAMIN) tablet Take 1 tablet by mouth daily.   Omega-3 Fatty Acids (OMEGA-3 FISH OIL PO) Take by mouth.   sennosides-docusate sodium (SENOKOT-S) 8.6-50 MG tablet Take 1 tablet by mouth daily.    Objective: BP (!) 152/102   Pulse (!) 107   Ht '5\' 6"'$  (1.676 m)   Wt 220 lb (99.8 kg)   LMP 06/05/2016 (Exact Date)   BMI 35.51 kg/m   Physical Exam:  General: Alert and oriented. and No acute distress. Gait: Left sided antalgic gait.  Left knee with mild  effusion.  She is able to achieve full extension, flexion beyond 110 degrees.  Negative Lachman.  No increased laxity varus valgus stress.  Tenderness palpation along the medial joint line.  Clinically, the left knee is in neutral.  IMAGING: I personally ordered and reviewed the following images  X-rays of the left knee were obtained in clinic today.  These demonstrate varus alignment to the left knee.  Near complete loss of joint space within the medial compartment.  There are associated osteophytes.  Small osteophytes noted within the medial and lateral facets of the patella.  Impression: Left knee arthritis, with near complete loss of joint space within the medial compartment.    New Medications:  No orders of the defined types were placed in this encounter.     Mordecai Rasmussen, MD  12/21/2021 10:27 AM

## 2021-12-21 NOTE — Patient Instructions (Signed)
It was a pleasure to see you today.  Thank you for giving [place] the opportunity to be involved in your care.  Below is a brief recap of your visit and next steps.  We will plan to see you again in November.  Summary We will check xrays and I have placed a referral to Orthopedic Surgery.  Next steps You have follow up scheduled with me for early November.

## 2021-12-31 ENCOUNTER — Encounter: Payer: Self-pay | Admitting: Orthopedic Surgery

## 2021-12-31 ENCOUNTER — Ambulatory Visit (INDEPENDENT_AMBULATORY_CARE_PROVIDER_SITE_OTHER): Payer: BC Managed Care – PPO | Admitting: Orthopedic Surgery

## 2021-12-31 DIAGNOSIS — M25562 Pain in left knee: Secondary | ICD-10-CM

## 2021-12-31 MED ORDER — METHYLPREDNISOLONE ACETATE 40 MG/ML IJ SUSP
40.0000 mg | Freq: Once | INTRAMUSCULAR | Status: AC
  Administered 2021-12-31: 40 mg via INTRA_ARTICULAR

## 2021-12-31 NOTE — Progress Notes (Unsigned)
Return patient Visit  Assessment: Carrie Santos is a 56 y.o. female with the following: 1. Acute pain of left knee  Plan: DAYZEE TROWER continues to have pain in her left knee.  We discussed an injection at the last visit, but she was not interested.  Given the lack of improvement since I last saw her, she is now interested in an injection.  This was completed today without issues.  Procedure note injection Left knee joint   Verbal consent was obtained to inject the left knee joint  Timeout was completed to confirm the site of injection.  The skin was prepped with alcohol and ethyl chloride was sprayed at the injection site.  A 21-gauge needle was used to inject 40 mg of Depo-Medrol and 1% lidocaine (3 cc) into the left knee using an anterolateral approach.  There were no complications. A sterile bandage was applied.     Follow-up: Return if symptoms worsen or fail to improve.  Subjective:  Chief Complaint  Patient presents with   Knee Pain    LT knee wants injection   Injections    LT knee injection    History of Present Illness: Carrie Santos is a 56 y.o. female who returns to clinic today for repeat evaluation of left knee pain.  I saw her few weeks ago, for the same complaint.  We reviewed radiographs, which were significant for degenerative changes.  We discussed multiple treatment options, but she was not interested in a steroid injection.  She has tried medications, and activity modification.  These have not improved her symptoms.  She is now interested in an injection.  Review of Systems: No fevers or chills No numbness or tingling No chest pain No shortness of breath No bowel or bladder dysfunction No GI distress No headaches   Objective: LMP 06/05/2016 (Exact Date)   Physical Exam:  General: Alert and oriented. and No acute distress. Gait: Left sided antalgic gait.  Left knee with mild effusion.  She is able to achieve full extension, flexion beyond  110 degrees.  Negative Lachman.  No increased laxity varus valgus stress.  Tenderness palpation along the medial joint line.  Clinically, the left knee is in neutral.  IMAGING: I personally ordered and reviewed the following images  No new imaging obtained today.    New Medications:  Meds ordered this encounter  Medications   methylPREDNISolone acetate (DEPO-MEDROL) injection 40 mg      Mordecai Rasmussen, MD  01/02/2022 11:45 PM

## 2021-12-31 NOTE — Patient Instructions (Signed)
Instructions Following Joint Injections  In clinic today, you received an injection in one of your joints (sometimes more than one).  Occasionally, you can have some pain at the injection site, this is normal.  You can place ice at the injection site, or take over-the-counter medications such as Tylenol (acetaminophen) or Advil (ibuprofen).  Please follow all directions listed on the bottle.  If your joint (knee or shoulder) becomes swollen, red or very painful, please contact the clinic for additional assistance.   Two medications were injected, including lidocaine and a steroid (often referred to as cortisone).  Lidocaine is effective almost immediately but wears off quickly.  However, the steroid can take a few days to improve your symptoms.  In some cases, it can make your pain worse for a couple of days.  Do not be concerned if this happens as it is common.  You can apply ice or take some over-the-counter medications as needed.      Knee Exercises  Ask your health care provider which exercises are safe for you. Do exercises exactly as told by your health care provider and adjust them as directed. It is normal to feel mild stretching, pulling, tightness, or discomfort as you do these exercises. Stop right away if you feel sudden pain or your pain gets worse. Do not begin these exercises until told by your health care provider.  Stretching and range-of-motion exercises These exercises warm up your muscles and joints and improve the movement and flexibility of your knee. These exercises also help to relieve pain and swelling.  Knee extension, prone Lie on your abdomen (prone position) on a bed. Place your left / right knee just beyond the edge of the surface so your knee is not on the bed. You can put a towel under your left / right thigh just above your kneecap for comfort. Relax your leg muscles and allow gravity to straighten your knee (extension). You should feel a stretch behind your left  / right knee. Hold this position for 10 seconds. Scoot up so your knee is supported between repetitions. Repeat 10 times. Complete this exercise 3-4 times per week.     Knee flexion, active Lie on your back with both legs straight. If this causes back discomfort, bend your left / right knee so your foot is flat on the floor. Slowly slide your left / right heel back toward your buttocks. Stop when you feel a gentle stretch in the front of your knee or thigh (flexion). Hold this position for 10 seconds. Slowly slide your left / right heel back to the starting position. Repeat 10 times. Complete this exercise 3-4 times per week.      Quadriceps stretch, prone Lie on your abdomen on a firm surface, such as a bed or padded floor. Bend your left / right knee and hold your ankle. If you cannot reach your ankle or pant leg, loop a belt around your foot and grab the belt instead. Gently pull your heel toward your buttocks. Your knee should not slide out to the side. You should feel a stretch in the front of your thigh and knee (quadriceps). Hold this position for 10 seconds. Repeat 10 times. Complete this exercise 3-4 times per week.      Hamstring, supine Lie on your back (supine position). Loop a belt or towel over the ball of your left / right foot. The ball of your foot is on the walking surface, right under your toes. Straighten your left /   right knee and slowly pull on the belt to raise your leg until you feel a gentle stretch behind your knee (hamstring). Do not let your knee bend while you do this. Keep your other leg flat on the floor. Hold this position for 10 seconds. Repeat 10 times. Complete this exercise 3-4 times per week.   Strengthening exercises These exercises build strength and endurance in your knee. Endurance is the ability to use your muscles for a long time, even after they get tired.  Quadriceps, isometric This exercise stretches the muscles in front of your thigh  (quadriceps) without moving your knee joint (isometric). Lie on your back with your left / right leg extended and your other knee bent. Put a rolled towel or small pillow under your knee if told by your health care provider. Slowly tense the muscles in the front of your left / right thigh. You should see your kneecap slide up toward your hip or see increased dimpling just above the knee. This motion will push the back of the knee toward the floor. For 10 seconds, hold the muscle as tight as you can without increasing your pain. Relax the muscles slowly and completely. Repeat 10 times. Complete this exercise 3-4 times per week. .     Straight leg raises This exercise stretches the muscles in front of your thigh (quadriceps) and the muscles that move your hips (hip flexors). Lie on your back with your left / right leg extended and your other knee bent. Tense the muscles in the front of your left / right thigh. You should see your kneecap slide up or see increased dimpling just above the knee. Your thigh may even shake a bit. Keep these muscles tight as you raise your leg 4-6 inches (10-15 cm) off the floor. Do not let your knee bend. Hold this position for 10 seconds. Keep these muscles tense as you lower your leg. Relax your muscles slowly and completely after each repetition. Repeat 10 times. Complete this exercise 3-4 times per week.  Hamstring, isometric Lie on your back on a firm surface. Bend your left / right knee about 30 degrees. Dig your left / right heel into the surface as if you are trying to pull it toward your buttocks. Tighten the muscles in the back of your thighs (hamstring) to "dig" as hard as you can without increasing any pain. Hold this position for 10 seconds. Release the tension gradually and allow your muscles to relax completely for __________ seconds after each repetition. Repeat 10 times. Complete this exercise 3-4 times per week.  Hamstring curls If told by your  health care provider, do this exercise while wearing ankle weights. Begin with 5 lb weights. Then increase the weight by 1 lb (0.5 kg) increments. You can also use an exercise band Lie on your abdomen with your legs straight. Bend your left / right knee as far as you can without feeling pain. Keep your hips flat against the floor. Hold this position for 10 seconds. Slowly lower your leg to the starting position. Repeat 10 times. Complete this exercise 3-4 times per week.      Squats This exercise strengthens the muscles in front of your thigh and knee (quadriceps). Stand in front of a table, with your feet and knees pointing straight ahead. You may rest your hands on the table for balance but not for support. Slowly bend your knees and lower your hips like you are going to sit in a chair. Keep   your weight over your heels, not over your toes. Keep your lower legs upright so they are parallel with the table legs. Do not let your hips go lower than your knees. Do not bend lower than told by your health care provider. If your knee pain increases, do not bend as low. Hold the squat position for 10 seconds. Slowly push with your legs to return to standing. Do not use your hands to pull yourself to standing. Repeat 10 times. Complete this exercise 3-4 times per week .     Wall slides This exercise strengthens the muscles in front of your thigh and knee (quadriceps). Lean your back against a smooth wall or door, and walk your feet out 18-24 inches (46-61 cm) from it. Place your feet hip-width apart. Slowly slide down the wall or door until your knees bend 90 degrees. Keep your knees over your heels, not over your toes. Keep your knees in line with your hips. Hold this position for 10 seconds. Repeat 10 times. Complete this exercise 3-4 times per week.      Straight leg raises This exercise strengthens the muscles that rotate the leg at the hip and move it away from your body (hip  abductors). Lie on your side with your left / right leg in the top position. Lie so your head, shoulder, knee, and hip line up. You may bend your bottom knee to help you keep your balance. Roll your hips slightly forward so your hips are stacked directly over each other and your left / right knee is facing forward. Leading with your heel, lift your top leg 4-6 inches (10-15 cm). You should feel the muscles in your outer hip lifting. Do not let your foot drift forward. Do not let your knee roll toward the ceiling. Hold this position for 10 seconds. Slowly return your leg to the starting position. Let your muscles relax completely after each repetition. Repeat 10 times. Complete this exercise 3-4 times per week.      Straight leg raises This exercise stretches the muscles that move your hips away from the front of the pelvis (hip extensors). Lie on your abdomen on a firm surface. You can put a pillow under your hips if that is more comfortable. Tense the muscles in your buttocks and lift your left / right leg about 4-6 inches (10-15 cm). Keep your knee straight as you lift your leg. Hold this position for 10 seconds. Slowly lower your leg to the starting position. Let your leg relax completely after each repetition. Repeat 10 times. Complete this exercise 3-4 times per week.  

## 2022-01-04 ENCOUNTER — Telehealth: Payer: Self-pay | Admitting: Internal Medicine

## 2022-01-04 DIAGNOSIS — M25562 Pain in left knee: Secondary | ICD-10-CM

## 2022-01-04 MED ORDER — MELOXICAM 7.5 MG PO TABS: 7.5000 mg | ORAL_TABLET | Freq: Every day | ORAL | 0 refills | Status: DC

## 2022-01-04 NOTE — Telephone Encounter (Signed)
I have prescribed meloxicam 7.5 mg daily x 14 days. She should not take ibuprofen while taking meloxicam.

## 2022-01-04 NOTE — Telephone Encounter (Signed)
Notified patient of meds.

## 2022-01-04 NOTE — Telephone Encounter (Signed)
Pt called stating she seen ortho on Friday & received a cortisone injections. States it still has not helped. She is wanting to know if she can please get something for arthritis pain?     [place]

## 2022-01-10 ENCOUNTER — Ambulatory Visit: Payer: BC Managed Care – PPO | Admitting: Internal Medicine

## 2022-01-15 ENCOUNTER — Other Ambulatory Visit: Payer: Self-pay | Admitting: Internal Medicine

## 2022-01-15 DIAGNOSIS — M25562 Pain in left knee: Secondary | ICD-10-CM

## 2022-01-20 ENCOUNTER — Other Ambulatory Visit: Payer: BC Managed Care – PPO

## 2022-01-21 ENCOUNTER — Encounter: Payer: Self-pay | Admitting: Internal Medicine

## 2022-01-21 ENCOUNTER — Ambulatory Visit: Payer: BC Managed Care – PPO | Admitting: Internal Medicine

## 2022-01-21 VITALS — BP 136/80 | HR 104 | Ht 66.0 in | Wt 216.2 lb

## 2022-01-21 DIAGNOSIS — E669 Obesity, unspecified: Secondary | ICD-10-CM

## 2022-01-21 DIAGNOSIS — M1712 Unilateral primary osteoarthritis, left knee: Secondary | ICD-10-CM

## 2022-01-21 MED ORDER — MELOXICAM 7.5 MG PO TABS: 7.5000 mg | ORAL_TABLET | Freq: Every day | ORAL | 0 refills | Status: DC | PRN

## 2022-01-21 MED ORDER — SEMAGLUTIDE-WEIGHT MANAGEMENT 0.25 MG/0.5ML ~~LOC~~ SOAJ: 0.2500 mg | SUBCUTANEOUS | 0 refills | Status: AC

## 2022-01-21 NOTE — Assessment & Plan Note (Signed)
Previously evaluated by me for acute left knee pain with effusion.  She was referred to orthopedic surgery.  X-rays subsequently showed degenerative changes with near complete loss of the medial joint space.  She received a steroid injection, which did not significantly improve her pain.  I prescribed meloxicam 7.5 mg daily x14 days, which was effective.  She returns to care today for reevaluation and states that her knee pain is improving.  She is not interested in surgery and has been going to the gym in an effort to lose weight and improve the strength in her left leg.  There is no effusion present on exam.  ROM is intact.  There is no tenderness palpation along the medial joint line. -Continue supportive measures for now.  I have provided her with home PT exercises for left knee arthritis. -At her request, I have refilled meloxicam for as needed use.

## 2022-01-21 NOTE — Progress Notes (Signed)
Established Patient Office Visit  Subjective   Patient ID: Carrie Santos, female    DOB: [birth year]  Age: 56 y.o. MRN: [id number]  Chief Complaint  Patient presents with   Follow-up    Knee and weight loss   Carrie Santos returns to care today.  She is a 56 year old woman with a past medical history significant for HTN, HLD, uterine fibroids, and obesity.  She was last seen by me on 10/3 for acute left knee pain with effusion.  She was referred to orthopedic surgery.  X-rays demonstrated arthritic changes.  She has since received a steroid injection, which minimally improved her pain.  I prescribed meloxicam 7.5 mg x 14 days on 10/17.  There have been no additional interval events.  Today Carrie Santos continues to endorse left knee pain.  She says the meloxicam has been beneficial.  She previously received a steroid injection that did not help.  She has been going to the [facility] to exercise and is wearing a sleeve on her left knee.  She additionally expresses an interest in weight loss today.  She is interested in starting [facility] because she has a friend who has lost significant weight while taking the medication.  Acute concerns, chronic medical conditions, and outstanding preventative care items discussed today are individually addressed in A/P below  Past Medical History:  Diagnosis Date   Anxiety 09/21/2015   Depression    Enlarged uterus 12/02/2014   Hot flashes 12/02/2014   Hyperlipidemia    Hypertension    RLQ abdominal pain 12/02/2014   Past Surgical History:  Procedure Laterality Date   KNEE SURGERY Left    Social History   Tobacco Use   Smoking status: Never   Smokeless tobacco: Never  Vaping Use   Vaping Use: Never used  Substance Use Topics   Alcohol use: Yes    Comment: occ   Drug use: No   Family History  Problem Relation Age of Onset   Depression Mother    Hypertension Mother    Hypertension Father    Hyperlipidemia Father    Hyperlipidemia Brother    Hypertension  Brother    Hyperlipidemia Brother    Hypertension Brother    Hyperlipidemia Brother    Hypertension Brother    Hyperlipidemia Brother    Hypertension Brother    Hyperlipidemia Brother    Hypertension Brother    Allergies  Allergen Reactions   Macrobid [Nitrofurantoin Monohyd Macro] Shortness Of Breath   Review of Systems  Constitutional:  Negative for chills and fever.  HENT:  Negative for sore throat.   Respiratory:  Negative for cough and shortness of breath.   Cardiovascular:  Negative for chest pain, palpitations and leg swelling.  Gastrointestinal:  Negative for abdominal pain, blood in stool, constipation, diarrhea, nausea and vomiting.  Genitourinary:  Negative for dysuria and hematuria.  Musculoskeletal:  Positive for joint pain (Left knee pain). Negative for myalgias.  Skin:  Negative for itching and rash.  Neurological:  Negative for dizziness and headaches.  Psychiatric/Behavioral:  Negative for depression and suicidal ideas.      Objective:     BP 136/80   Pulse (!) 104   Ht _0  (1.676 m)   Wt 216 lb 3.2 oz (98.1 kg)   LMP 06/05/2016 (Exact Date)   SpO2 96%   BMI 34.90 kg/m  BP Readings from Last 3 Encounters:  01/21/22 136/80  12/21/21 (!) 152/102  12/21/21 138/88      Physical Exam  Vitals reviewed.  Constitutional:      General: She is not in acute distress.    Appearance: Normal appearance. She is obese. She is not toxic-appearing.  HENT:     Head: Normocephalic and atraumatic.     Right Ear: External ear normal.     Left Ear: External ear normal.     Nose: Nose normal. No congestion or rhinorrhea.     Mouth/Throat:     Mouth: Mucous membranes are moist.     Pharynx: Oropharynx is clear. No oropharyngeal exudate or posterior oropharyngeal erythema.  Eyes:     General: No scleral icterus.    Extraocular Movements: Extraocular movements intact.     Conjunctiva/sclera: Conjunctivae normal.     Pupils: Pupils are equal, round, and reactive to  light.  Cardiovascular:     Rate and Rhythm: Normal rate and regular rhythm.     Pulses: Normal pulses.     Heart sounds: Normal heart sounds. No murmur heard.    No friction rub. No gallop.  Pulmonary:     Effort: Pulmonary effort is normal.     Breath sounds: Normal breath sounds. No wheezing, rhonchi or rales.  Abdominal:     General: Abdomen is flat. Bowel sounds are normal. There is no distension.     Palpations: Abdomen is soft.     Tenderness: There is no abdominal tenderness.  Musculoskeletal:        General: No swelling. Normal range of motion.     Cervical back: Normal range of motion.     Right lower leg: No edema.     Left lower leg: No edema.     Comments: No obvious deformity or effusion present on inspection of the left knee.  ROM is intact.  There is crepitus present.  Tenderness palpation along the medial or lateral joint lines.  Lymphadenopathy:     Cervical: No cervical adenopathy.  Skin:    General: Skin is warm and dry.     Capillary Refill: Capillary refill takes less than 2 seconds.     Coloration: Skin is not jaundiced.  Neurological:     General: No focal deficit present.     Mental Status: She is alert and oriented to person, place, and time.  Psychiatric:        Mood and Affect: Mood normal.        Behavior: Behavior normal.    Last metabolic panel Lab Results  Component Value Date   GLUCOSE 74 12/13/2021   NA 141 12/13/2021   K 4.1 12/13/2021   CL 101 12/13/2021   CO2 22 12/13/2021   BUN 16 12/13/2021   CREATININE 0.88 12/13/2021   EGFR 77 12/13/2021   CALCIUM 9.7 12/13/2021     Assessment & Plan:   Problem List Items Addressed This Visit       Osteoarthritis of left knee    Previously evaluated by me for acute left knee pain with effusion.  She was referred to orthopedic surgery.  X-rays subsequently showed degenerative changes with near complete loss of the medial joint space.  She received a steroid injection, which did not  significantly improve her pain.  I prescribed meloxicam 7.5 mg daily x14 days, which was effective.  She returns to care today for reevaluation and states that her knee pain is improving.  She is not interested in surgery and has been going to the gym in an effort to lose weight and improve the strength in her left leg.  There is no effusion present on exam.  ROM is intact.  There is no tenderness palpation along the medial joint line. -Continue supportive measures for now.  I have provided her with home PT exercises for left knee arthritis. -At her request, I have refilled meloxicam for as needed use.      Obesity (BMI 30.0-34.9) - Primary    She again expresses an interest in losing weight and would like to start [facility].  She has a friend that has lost significant weight while taking Wegovy.  Ms. Szczygiel has made dietary modifications and is going to the gym regularly. -Start Wegovy 0.25 mg weekly injection today.  We will follow-up in 4 weeks for reassessment.      Relevant Medications   Semaglutide-Weight Management 0.25 MG/0.5ML SOAJ    Return in about 4 weeks (around 02/18/2022).    Johnette Abraham, MD

## 2022-01-21 NOTE — Assessment & Plan Note (Signed)
She again expresses an interest in losing weight and would like to start [facility].  She has a friend that has lost significant weight while taking Wegovy.  Ms. Klostermann has made dietary modifications and is going to the gym regularly. -Start Wegovy 0.25 mg weekly injection today.  We will follow-up in 4 weeks for reassessment.

## 2022-01-21 NOTE — Patient Instructions (Signed)
It was a pleasure to see you today.  Thank you for giving [place] the opportunity to be involved in your care.  Below is a brief recap of your visit and next steps.  We will plan to see you again in 4 weeks.  Summary I have prescribed Wegovy today for weight loss. We will follow up in 4 weeks to make sure the injections are going well. Please see the attached exercises for your knee. I will also refill meloxicam.

## 2022-01-27 ENCOUNTER — Other Ambulatory Visit: Payer: Self-pay | Admitting: Internal Medicine

## 2022-02-07 ENCOUNTER — Other Ambulatory Visit: Payer: Self-pay | Admitting: Adult Health

## 2022-02-07 ENCOUNTER — Other Ambulatory Visit: Payer: Self-pay | Admitting: Internal Medicine

## 2022-02-08 ENCOUNTER — Other Ambulatory Visit (HOSPITAL_COMMUNITY): Payer: Self-pay

## 2022-02-09 ENCOUNTER — Other Ambulatory Visit (HOSPITAL_COMMUNITY): Payer: Self-pay

## 2022-02-09 MED ORDER — AMLODIPINE BESYLATE 5 MG PO TABS
5.0000 mg | ORAL_TABLET | Freq: Every day | ORAL | 0 refills | Status: DC
  Filled 2022-02-09: qty 90, 90d supply, fill #0

## 2022-02-09 MED ORDER — MELOXICAM 7.5 MG PO TABS
7.5000 mg | ORAL_TABLET | Freq: Every day | ORAL | 0 refills | Status: DC | PRN
  Filled 2022-02-09: qty 30, 30d supply, fill #0

## 2022-02-09 MED ORDER — FLUTICASONE PROPIONATE 50 MCG/ACT NA SUSP
2.0000 | Freq: Every day | NASAL | 0 refills | Status: DC
  Filled 2022-02-09 – 2022-03-22 (×2): qty 16, 30d supply, fill #0

## 2022-02-09 MED ORDER — SEMAGLUTIDE-WEIGHT MANAGEMENT 0.25 MG/0.5ML ~~LOC~~ SOAJ
0.2500 mg | SUBCUTANEOUS | 0 refills | Status: DC
  Filled 2022-02-09: qty 2, 28d supply, fill #0

## 2022-02-09 MED ORDER — ATORVASTATIN CALCIUM 40 MG PO TABS
20.0000 mg | ORAL_TABLET | Freq: Every evening | ORAL | 0 refills | Status: DC
  Filled 2022-02-09: qty 45, 90d supply, fill #0

## 2022-02-20 ENCOUNTER — Other Ambulatory Visit: Payer: Self-pay | Admitting: Internal Medicine

## 2022-02-21 ENCOUNTER — Ambulatory Visit: Payer: BC Managed Care – PPO | Admitting: Internal Medicine

## 2022-02-23 ENCOUNTER — Other Ambulatory Visit (HOSPITAL_COMMUNITY): Payer: Self-pay

## 2022-02-24 ENCOUNTER — Other Ambulatory Visit: Payer: Self-pay | Admitting: Internal Medicine

## 2022-02-24 DIAGNOSIS — Z1231 Encounter for screening mammogram for malignant neoplasm of breast: Secondary | ICD-10-CM

## 2022-03-02 ENCOUNTER — Other Ambulatory Visit: Payer: Self-pay | Admitting: Internal Medicine

## 2022-03-03 ENCOUNTER — Other Ambulatory Visit: Payer: Self-pay

## 2022-03-07 ENCOUNTER — Telehealth: Payer: Self-pay | Admitting: Internal Medicine

## 2022-03-07 ENCOUNTER — Other Ambulatory Visit: Payer: Self-pay

## 2022-03-07 MED ORDER — MELOXICAM 7.5 MG PO TABS: 7.5000 mg | ORAL_TABLET | Freq: Every day | ORAL | 0 refills | Status: DC | PRN

## 2022-03-07 NOTE — Telephone Encounter (Signed)
Med refills (patient said it was denied)   meloxicam (MOBIC) 7.5 MG tablet [[id number]]     Pharmacy  [facility] [phone] - [place], [place] AT [address], [place] [address] Phone: [phone]  Fax: [phone]

## 2022-03-15 ENCOUNTER — Other Ambulatory Visit (HOSPITAL_COMMUNITY): Payer: Self-pay

## 2022-03-22 ENCOUNTER — Other Ambulatory Visit (HOSPITAL_COMMUNITY): Payer: Self-pay

## 2022-04-04 ENCOUNTER — Other Ambulatory Visit: Payer: Self-pay | Admitting: Internal Medicine

## 2022-04-07 ENCOUNTER — Other Ambulatory Visit: Payer: Self-pay | Admitting: Internal Medicine

## 2022-04-11 ENCOUNTER — Other Ambulatory Visit: Payer: Self-pay

## 2022-04-11 MED ORDER — ATORVASTATIN CALCIUM 40 MG PO TABS: 20.0000 mg | ORAL_TABLET | Freq: Every evening | ORAL | 1 refills | Status: DC

## 2022-04-21 ENCOUNTER — Other Ambulatory Visit: Payer: Self-pay | Admitting: Internal Medicine

## 2022-04-21 ENCOUNTER — Other Ambulatory Visit: Payer: Self-pay

## 2022-04-21 ENCOUNTER — Other Ambulatory Visit (HOSPITAL_COMMUNITY): Payer: Self-pay

## 2022-04-21 ENCOUNTER — Ambulatory Visit: Payer: BC Managed Care – PPO

## 2022-04-21 MED ORDER — WEGOVY 0.25 MG/0.5ML ~~LOC~~ SOAJ
0.2500 mg | SUBCUTANEOUS | 0 refills | Status: DC
  Filled 2022-04-21: qty 2, 28d supply, fill #0

## 2022-05-03 ENCOUNTER — Other Ambulatory Visit: Payer: Self-pay | Admitting: Internal Medicine

## 2022-05-03 ENCOUNTER — Other Ambulatory Visit (HOSPITAL_COMMUNITY): Payer: Self-pay

## 2022-05-09 ENCOUNTER — Ambulatory Visit: Payer: BC Managed Care – PPO | Admitting: Internal Medicine

## 2022-05-12 ENCOUNTER — Other Ambulatory Visit: Payer: Self-pay | Admitting: Adult Health

## 2022-05-19 ENCOUNTER — Encounter: Payer: Self-pay | Admitting: Radiology

## 2022-05-30 ENCOUNTER — Other Ambulatory Visit (HOSPITAL_COMMUNITY): Payer: Self-pay

## 2022-05-30 ENCOUNTER — Other Ambulatory Visit: Payer: Self-pay | Admitting: Internal Medicine

## 2022-06-03 ENCOUNTER — Other Ambulatory Visit (HOSPITAL_COMMUNITY): Payer: Self-pay

## 2022-06-03 MED ORDER — SEMAGLUTIDE-WEIGHT MANAGEMENT 0.5 MG/0.5ML ~~LOC~~ SOAJ: 0.5000 mg | SUBCUTANEOUS | 0 refills | Status: DC

## 2022-06-03 NOTE — Telephone Encounter (Signed)
Pt states she out of Semaglutide-Weight Management (WEGOVY) 0.25 MG/0.5ML SOAJ and needs dosage increased. She does have an appt with [place] on 06/15/22?   Can you please refill?

## 2022-06-06 ENCOUNTER — Other Ambulatory Visit (HOSPITAL_COMMUNITY): Payer: Self-pay

## 2022-06-07 ENCOUNTER — Other Ambulatory Visit (HOSPITAL_COMMUNITY): Payer: Self-pay

## 2022-06-15 ENCOUNTER — Other Ambulatory Visit (HOSPITAL_COMMUNITY): Payer: Self-pay

## 2022-06-15 ENCOUNTER — Encounter: Payer: Self-pay | Admitting: Internal Medicine

## 2022-06-15 ENCOUNTER — Other Ambulatory Visit: Payer: Self-pay

## 2022-06-15 ENCOUNTER — Ambulatory Visit: Payer: BC Managed Care – PPO | Admitting: Internal Medicine

## 2022-06-15 ENCOUNTER — Telehealth: Payer: Self-pay | Admitting: Internal Medicine

## 2022-06-15 VITALS — BP 126/73 | HR 109 | Ht 66.0 in | Wt 212.4 lb

## 2022-06-15 DIAGNOSIS — I10 Essential (primary) hypertension: Secondary | ICD-10-CM | POA: Diagnosis not present

## 2022-06-15 DIAGNOSIS — E669 Obesity, unspecified: Secondary | ICD-10-CM | POA: Diagnosis not present

## 2022-06-15 DIAGNOSIS — M1712 Unilateral primary osteoarthritis, left knee: Secondary | ICD-10-CM

## 2022-06-15 DIAGNOSIS — Z0001 Encounter for general adult medical examination with abnormal findings: Secondary | ICD-10-CM

## 2022-06-15 DIAGNOSIS — Z1329 Encounter for screening for other suspected endocrine disorder: Secondary | ICD-10-CM

## 2022-06-15 DIAGNOSIS — Z1321 Encounter for screening for nutritional disorder: Secondary | ICD-10-CM

## 2022-06-15 DIAGNOSIS — E782 Mixed hyperlipidemia: Secondary | ICD-10-CM

## 2022-06-15 MED ORDER — SEMAGLUTIDE-WEIGHT MANAGEMENT 0.5 MG/0.5ML ~~LOC~~ SOAJ
0.5000 mg | SUBCUTANEOUS | 0 refills | Status: DC
  Filled 2022-06-15: qty 2, 28d supply, fill #0

## 2022-06-15 MED ORDER — SEMAGLUTIDE-WEIGHT MANAGEMENT 0.5 MG/0.5ML ~~LOC~~ SOAJ: 0.5000 mg | SUBCUTANEOUS | 0 refills | Status: AC

## 2022-06-15 NOTE — Assessment & Plan Note (Signed)
BMI 34.2.  She has lost 4 pounds since her last appointment.  She has completed 4 injections of Wegovy 0.25 mg and is ready to increase to 0.5 mg weekly.  She has also made significant lifestyle modifications aimed at weight loss. -Increase Wegovy to 0.5 mg weekly.  Plan to increase to 1 mg after she completes 4 injections of 0.5 mg

## 2022-06-15 NOTE — Telephone Encounter (Signed)
Patient aclled said no pharmacy has the wegovy .05 mg shots, no one has it.  Where can patient go get this? [facility] .

## 2022-06-15 NOTE — Telephone Encounter (Signed)
Spoke with patient.

## 2022-06-15 NOTE — Progress Notes (Signed)
Established Patient Office Visit  Subjective   Patient ID: Carrie Santos, female    DOB: 1965-08-11  Age: 57 y.o. MRN: [id number]  Chief Complaint  Patient presents with   Weight Loss    Follow up   Carrie Santos returns to care today for follow-up.  She was last evaluated by me on 01/21/22 at which time Carrie Santos was initiated in the setting of obesity.  4-week follow-up was arranged.  There have been no acute interval events.  Carrie Santos reports feeling well today.  She states that her left knee pain has resolved.  She has establish care with [name] and has received for gel injections since January.  She has not needed to use meloxicam and requests that it is removed from her medication list.  Her additional concern today is wanting to stop amlodipine.  She checks her blood pressure regularly at home and reports that it is well-controlled.  She would prefer to increase lisinopril-HCTZ if needed in the future for improved HTN control.  Carrie Santos reports that she started [facility] in November but was unable to fill future prescriptions for 0.5 mg.  She restarted Wegovy last month and completed 4 injections.  Her last injection was 1 week ago.  She has lost 4 pounds since starting Wegovy and has adhered to dietary and exercise modifications.  Past Medical History:  Diagnosis Date   Anxiety 09/21/2015   Depression    Enlarged uterus 12/02/2014   Hot flashes 12/02/2014   Hyperlipidemia    Hypertension    RLQ abdominal pain 12/02/2014   Past Surgical History:  Procedure Laterality Date   KNEE SURGERY Left    Social History   Tobacco Use   Smoking status: Never   Smokeless tobacco: Never  Vaping Use   Vaping Use: Never used  Substance Use Topics   Alcohol use: Yes    Comment: occ   Drug use: No   Family History  Problem Relation Age of Onset   Depression Mother    Hypertension Mother    Hypertension Father    Hyperlipidemia Father    Hyperlipidemia Brother    Hypertension  Brother    Hyperlipidemia Brother    Hypertension Brother    Hyperlipidemia Brother    Hypertension Brother    Hyperlipidemia Brother    Hypertension Brother    Hyperlipidemia Brother    Hypertension Brother    Allergies  Allergen Reactions   Macrobid [Nitrofurantoin Monohyd Macro] Shortness Of Breath   Review of Systems  Constitutional:  Negative for chills and fever.  HENT:  Negative for sore throat.   Respiratory:  Negative for cough and shortness of breath.   Cardiovascular:  Negative for chest pain, palpitations and leg swelling.  Gastrointestinal:  Negative for abdominal pain, blood in stool, constipation, diarrhea, nausea and vomiting.  Genitourinary:  Negative for dysuria and hematuria.  Musculoskeletal:  Negative for myalgias.  Skin:  Negative for itching and rash.  Neurological:  Negative for dizziness and headaches.  Psychiatric/Behavioral:  Negative for depression and suicidal ideas.       Objective:     BP 126/73   Pulse (!) 109   Ht 5\' 6"  (1.676 m)   Wt 212 lb 6.4 oz (96.3 kg)   LMP 06/05/2016 (Exact Date)   SpO2 94%   BMI 34.28 kg/m  BP Readings from Last 3 Encounters:  06/15/22 126/73  01/21/22 136/80  12/21/21 (!) 152/102   Physical Exam Vitals reviewed.  Constitutional:  General: She is not in acute distress.    Appearance: Normal appearance. She is obese. She is not toxic-appearing.  HENT:     Head: Normocephalic and atraumatic.     Right Ear: External ear normal.     Left Ear: External ear normal.     Nose: Nose normal. No congestion or rhinorrhea.     Mouth/Throat:     Mouth: Mucous membranes are moist.     Pharynx: Oropharynx is clear. No oropharyngeal exudate or posterior oropharyngeal erythema.  Eyes:     General: No scleral icterus.    Extraocular Movements: Extraocular movements intact.     Conjunctiva/sclera: Conjunctivae normal.     Pupils: Pupils are equal, round, and reactive to light.  Cardiovascular:     Rate and  Rhythm: Normal rate and regular rhythm.     Pulses: Normal pulses.     Heart sounds: Normal heart sounds. No murmur heard.    No friction rub. No gallop.  Pulmonary:     Effort: Pulmonary effort is normal.     Breath sounds: Normal breath sounds. No wheezing, rhonchi or rales.  Abdominal:     General: Abdomen is flat. Bowel sounds are normal. There is no distension.     Palpations: Abdomen is soft.     Tenderness: There is no abdominal tenderness.  Musculoskeletal:        General: No swelling. Normal range of motion.     Cervical back: Normal range of motion.     Right lower leg: No edema.     Left lower leg: No edema.  Lymphadenopathy:     Cervical: No cervical adenopathy.  Skin:    General: Skin is warm and dry.     Capillary Refill: Capillary refill takes less than 2 seconds.     Coloration: Skin is not jaundiced.  Neurological:     General: No focal deficit present.     Mental Status: She is alert and oriented to person, place, and time.  Psychiatric:        Mood and Affect: Mood normal.        Behavior: Behavior normal.      Assessment & Plan:   Problem List Items Addressed This Visit       Essential hypertension    BP today is 126/73.  She is currently prescribed amlodipine 5 mg daily and lisinopril-HCTZ 10-12.5 mg daily.  She has requested to stop amlodipine because her blood pressure is well-controlled and she does not want to take more than 1 medication if possible. -Discontinue amlodipine -Continue lisinopril-HCTZ at current dose.  Can increase dose in future if needed for improved HTN control.      Osteoarthritis of left knee    Followed by Cassie Freer.  Receiving gel injections.  Has not needed to use meloxicam and requests that it is removed from her medication list today.      Hyperlipidemia    Currently prescribed atorvastatin 20 mg daily.  Lipid panel last updated in July 2023. -Repeat lipid panel ordered today      Obesity (BMI 30.0-34.9) -  Primary    BMI 34.2.  She has lost 4 pounds since her last appointment.  She has completed 4 injections of Wegovy 0.25 mg and is ready to increase to 0.5 mg weekly.  She has also made significant lifestyle modifications aimed at weight loss. -Increase Wegovy to 0.5 mg weekly.  Plan to increase to 1 mg after she completes 4 injections of 0.5  mg      Return in about 3 months (around 09/15/2022).   Johnette Abraham, MD

## 2022-06-15 NOTE — Assessment & Plan Note (Addendum)
Followed by Cassie Freer.  Receiving gel injections.  Has not needed to use meloxicam and requests that it is removed from her medication list today.

## 2022-06-15 NOTE — Patient Instructions (Signed)
It was a pleasure to see you today.  Thank you for giving [place] the opportunity to be involved in your care.  Below is a brief recap of your visit and next steps.  We will plan to see you again in 3 months.  Summary Increase Wegovy to 0.5 mg weekly Discontinue amlodipine Repeat labs Follow up in 3 months

## 2022-06-15 NOTE — Assessment & Plan Note (Signed)
BP today is 126/73.  She is currently prescribed amlodipine 5 mg daily and lisinopril-HCTZ 10-12.5 mg daily.  She has requested to stop amlodipine because her blood pressure is well-controlled and she does not want to take more than 1 medication if possible. -Discontinue amlodipine -Continue lisinopril-HCTZ at current dose.  Can increase dose in future if needed for improved HTN control.

## 2022-06-15 NOTE — Assessment & Plan Note (Signed)
Currently prescribed atorvastatin 20 mg daily.  Lipid panel last updated in July 2023. -Repeat lipid panel ordered today

## 2022-06-16 ENCOUNTER — Other Ambulatory Visit (HOSPITAL_COMMUNITY): Payer: Self-pay

## 2022-06-16 ENCOUNTER — Telehealth: Payer: Self-pay | Admitting: Internal Medicine

## 2022-06-16 ENCOUNTER — Ambulatory Visit
Admission: RE | Admit: 2022-06-16 | Discharge: 2022-06-16 | Disposition: A | Discharge status: Home / Self Care | Payer: BC Managed Care – PPO | Source: Ambulatory Visit | Attending: Internal Medicine | Admitting: Internal Medicine

## 2022-06-16 DIAGNOSIS — Z1231 Encounter for screening mammogram for malignant neoplasm of breast: Secondary | ICD-10-CM

## 2022-06-16 LAB — LIPID PANEL

## 2022-06-16 NOTE — Telephone Encounter (Signed)
Spoke with patient.

## 2022-06-16 NOTE — Telephone Encounter (Signed)
Pt called in regards to medication being sent to [place]?

## 2022-06-18 LAB — CBC WITH DIFFERENTIAL/PLATELET
Basophils Absolute: 0 10*3/uL (ref 0.0–0.2)
Basos: 0 %
EOS (ABSOLUTE): 0.1 10*3/uL (ref 0.0–0.4)
Eos: 1 %
Hematocrit: 40.4 % (ref 34.0–46.6)
Hemoglobin: 13 g/dL (ref 11.1–15.9)
Immature Grans (Abs): 0 10*3/uL (ref 0.0–0.1)
Immature Granulocytes: 0 %
Lymphocytes Absolute: 3.6 10*3/uL — ABNORMAL HIGH (ref 0.7–3.1)
Lymphs: 35 %
MCH: 29.4 pg (ref 26.6–33.0)
MCHC: 32.2 g/dL (ref 31.5–35.7)
MCV: 91 fL (ref 79–97)
Monocytes Absolute: 0.6 10*3/uL (ref 0.1–0.9)
Monocytes: 6 %
Neutrophils Absolute: 5.9 10*3/uL (ref 1.4–7.0)
Neutrophils: 58 %
Platelets: 402 10*3/uL (ref 150–450)
RBC: 4.42 x10E6/uL (ref 3.77–5.28)
RDW: 12.5 % (ref 11.7–15.4)
WBC: 10.3 10*3/uL (ref 3.4–10.8)

## 2022-06-18 LAB — CMP14+EGFR
ALT: 17 IU/L (ref 0–32)
AST: 17 IU/L (ref 0–40)
Albumin/Globulin Ratio: 1.5 (ref 1.2–2.2)
Albumin: 4.2 g/dL (ref 3.8–4.9)
Alkaline Phosphatase: 85 IU/L (ref 44–121)
BUN/Creatinine Ratio: 18 (ref 9–23)
BUN: 12 mg/dL (ref 6–24)
Bilirubin Total: 0.2 mg/dL (ref 0.0–1.2)
CO2: 22 mmol/L (ref 20–29)
Calcium: 9.9 mg/dL (ref 8.7–10.2)
Chloride: 103 mmol/L (ref 96–106)
Creatinine, Ser: 0.68 mg/dL (ref 0.57–1.00)
Globulin, Total: 2.8 g/dL (ref 1.5–4.5)
Glucose: 103 mg/dL — ABNORMAL HIGH (ref 70–99)
Potassium: 3.9 mmol/L (ref 3.5–5.2)
Sodium: 142 mmol/L (ref 134–144)
Total Protein: 7 g/dL (ref 6.0–8.5)
eGFR: 101 mL/min/{1.73_m2} (ref >59)

## 2022-06-18 LAB — LIPID PANEL
Chol/HDL Ratio: 3.9 ratio (ref 0.0–4.4)
Cholesterol, Total: 183 mg/dL (ref 100–199)
HDL: 47 mg/dL (ref >39)
LDL Chol Calc (NIH): 105 mg/dL — ABNORMAL HIGH (ref 0–99)
Triglycerides: 181 mg/dL — ABNORMAL HIGH (ref 0–149)
VLDL Cholesterol Cal: 31 mg/dL (ref 5–40)

## 2022-06-18 LAB — TSH+FREE T4
Free T4: 1.16 ng/dL (ref 0.82–1.77)
TSH: 0.671 u[IU]/mL (ref 0.450–4.500)

## 2022-06-18 LAB — HEMOGLOBIN A1C
Est. average glucose Bld gHb Est-mCnc: 103 mg/dL
Hgb A1c MFr Bld: 5.2 % (ref 4.8–5.6)

## 2022-06-18 LAB — B12 AND FOLATE PANEL
Folate: 7.9 ng/mL (ref >3.0)
Vitamin B-12: >2000 pg/mL — ABNORMAL HIGH (ref 232–1245)

## 2022-06-18 LAB — VITAMIN D 25 HYDROXY (VIT D DEFICIENCY, FRACTURES): Vit D, 25-Hydroxy: 57 ng/mL (ref 30.0–100.0)

## 2022-06-20 ENCOUNTER — Ambulatory Visit: Payer: BC Managed Care – PPO | Admitting: Family Medicine

## 2022-06-20 ENCOUNTER — Encounter: Payer: Self-pay | Admitting: Family Medicine

## 2022-06-20 VITALS — BP 132/82 | HR 92 | Ht 66.0 in | Wt 209.0 lb

## 2022-06-20 DIAGNOSIS — Z86018 Personal history of other benign neoplasm: Secondary | ICD-10-CM | POA: Diagnosis not present

## 2022-06-20 DIAGNOSIS — R1031 Right lower quadrant pain: Secondary | ICD-10-CM

## 2022-06-20 DIAGNOSIS — N951 Menopausal and female climacteric states: Secondary | ICD-10-CM

## 2022-06-20 MED ORDER — LUVENA PREBIOTIC LUBRICANT VA SOLN: VAGINAL | 1 refills | Status: DC

## 2022-06-20 NOTE — Patient Instructions (Signed)
It was pleasure meeting with you today. Please Follow up with GYN for further evaluation.  Follow up with your primary health provider if any health concerns arises.

## 2022-06-20 NOTE — Assessment & Plan Note (Addendum)
Patient reported will follow up with GYN due to hx of uterine fibroids. Discussed Increase oral fluid intake. Bland diet as tolerated. Avoid fluids that have a lot sugar or caffeine, Avoid spicy or fatty food. Avoid alcohol. Can take OTC tylenol for pain. Follow-up in unable to keep food/fluid down x 24 hours, dizziness, fevers, worsening or persistent symptoms to present to ED or contact primary care provider. Patient verbalizes understanding regarding plan of care and all questions answered.

## 2022-06-20 NOTE — Progress Notes (Signed)
Patient Office Visit   Subjective   Patient ID: Carrie Santos, female    DOB: October 28, 1965  Age: 57 y.o. MRN: [id number]  CC:  Chief Complaint  Patient presents with   Fibroids    Patient states that she has uterine fibroids that have not bothered her but now they are getting painful.     HPI Carrie Santos 57 year old female, presents to the clinic for right lower abdominal pain. She  has a past medical history of Anxiety (09/21/2015), Depression, Enlarged uterus (12/02/2014), Hot flashes (12/02/2014), Hyperlipidemia, Hypertension, and RLQ abdominal pain (12/02/2014).  Abdominal Pain The current episode started 4 months ago. Patient reports pain occurs intermittently and  has been gradually worsening. The pain is located in the RLQ. The pain is at a severity of 6/10. The quality of the pain is aching. The abdominal pain does not radiate. Associated symptoms include constipation and frequency. Pertinent negatives include no dysuria, hematochezia, hematuria, nausea or vomiting. The pain is aggravated by being still. She has tried acetaminophen for the symptoms. The treatment provided mild relief.       Outpatient Encounter Medications as of 06/20/2022  Medication Sig   atorvastatin (LIPITOR) 40 MG tablet Take 20 mg by mouth every evening.    atorvastatin (LIPITOR) 40 MG tablet Take 1/2 tablet (20 mg total) by mouth every evening.   atorvastatin (LIPITOR) 40 MG tablet Take 0.5 tablets (20 mg total) by mouth every evening.   cholecalciferol (VITAMIN D) 1000 UNITS tablet Take 5,000 Units by mouth daily.   Cyanocobalamin (VITAMIN B12 PO) Take by mouth daily.   fluticasone (FLONASE) 50 MCG/ACT nasal spray two sprays by Both Nostrils route daily.   fluticasone (FLONASE) 50 MCG/ACT nasal spray Shake Liquid and use 2 sprays into both nostrils daily.   lisinopril-hydrochlorothiazide (PRINZIDE,ZESTORETIC) 10-12.5 MG tablet Take 1 tablet by mouth daily.    Multiple Vitamin (MULTIVITAMIN) tablet Take 1  tablet by mouth daily.   Omega-3 Fatty Acids (OMEGA-3 FISH OIL PO) Take by mouth.   Semaglutide-Weight Management 0.5 MG/0.5ML SOAJ Inject 0.5 mg into the skin once a week for 28 days.   sennosides-docusate sodium (SENOKOT-S) 8.6-50 MG tablet Take 1 tablet by mouth daily.   Vaginal Moisturizer (LUVENA PREBIOTIC LUBRICANT) SOLN Use every 3 days as moisturizer or lubricant.   No facility-administered encounter medications on file as of 06/20/2022.    Past Surgical History:  Procedure Laterality Date   KNEE SURGERY Left     Review of Systems  Constitutional:  Negative for chills and fever.  Respiratory:  Negative for shortness of breath.   Cardiovascular:  Negative for chest pain.  Gastrointestinal:  Positive for abdominal pain and constipation. Negative for blood in stool, diarrhea, heartburn, melena, nausea and vomiting.  Genitourinary:  Positive for frequency. Negative for dysuria, flank pain, hematuria and urgency.  Musculoskeletal:  Negative for myalgias.  Neurological:  Negative for dizziness and headaches.  Psychiatric/Behavioral:  Negative for depression.       Objective    BP 132/82   Pulse 92   Ht 5\' 6"  (1.676 m)   Wt 209 lb (94.8 kg)   LMP 06/05/2016 (Exact Date)   BMI 33.73 kg/m   Physical Exam HENT:     Head: Normocephalic.  Cardiovascular:     Rate and Rhythm: Normal rate and regular rhythm.     Pulses: Normal pulses.  Pulmonary:     Effort: Pulmonary effort is normal.     Breath sounds: Normal breath  sounds.  Abdominal:     General: Bowel sounds are normal.     Palpations: Abdomen is soft.     Tenderness: There is abdominal tenderness in the right lower quadrant. There is guarding. Negative signs include Murphy's sign, Rovsing's sign and McBurney's sign.  Musculoskeletal:        General: Normal range of motion.  Skin:    General: Skin is warm and dry.     Capillary Refill: Capillary refill takes less than 2 seconds.  Neurological:     General: No focal  deficit present.     Mental Status: She is alert.  Psychiatric:        Mood and Affect: Mood normal.       Assessment & Plan:  History of uterine fibroid  Vaginal dryness, menopausal -     Luvena Prebiotic Lubricant; Use every 3 days as moisturizer or lubricant.  Dispense: 39 mL; Refill: 1  RLQ abdominal pain Assessment & Plan: Patient reported will follow up with GYN due to hx of uterine fibroids. Discussed Increase oral fluid intake. Bland diet as tolerated. Avoid fluids that have a lot sugar or caffeine, Avoid spicy or fatty food. Avoid alcohol. Can take OTC tylenol for pain. Follow-up in unable to keep food/fluid down x 24 hours, dizziness, fevers, worsening or persistent symptoms to present to ED or contact primary care provider. Patient verbalizes understanding regarding plan of care and all questions answered.      No follow-ups on file.   Renard Hamper Ria Comment, FNP

## 2022-06-22 ENCOUNTER — Ambulatory Visit: Payer: BC Managed Care – PPO | Admitting: Family Medicine

## 2022-06-23 ENCOUNTER — Ambulatory Visit: Payer: BC Managed Care – PPO | Admitting: Adult Health

## 2022-06-23 ENCOUNTER — Encounter: Payer: Self-pay | Admitting: Adult Health

## 2022-06-23 VITALS — BP 134/85 | HR 90 | Ht 66.0 in | Wt 216.5 lb

## 2022-06-23 DIAGNOSIS — D219 Benign neoplasm of connective and other soft tissue, unspecified: Secondary | ICD-10-CM

## 2022-06-23 DIAGNOSIS — N898 Other specified noninflammatory disorders of vagina: Secondary | ICD-10-CM | POA: Diagnosis not present

## 2022-06-23 DIAGNOSIS — N852 Hypertrophy of uterus: Secondary | ICD-10-CM

## 2022-06-23 DIAGNOSIS — R1031 Right lower quadrant pain: Secondary | ICD-10-CM

## 2022-06-23 DIAGNOSIS — N941 Unspecified dyspareunia: Secondary | ICD-10-CM | POA: Diagnosis not present

## 2022-06-23 MED ORDER — IBUPROFEN 800 MG PO TABS: 800.0000 mg | ORAL_TABLET | Freq: Three times a day (TID) | ORAL | 1 refills | Status: DC | PRN

## 2022-06-23 MED ORDER — PREMARIN 0.625 MG/GM VA CREA: TOPICAL_CREAM | VAGINAL | 2 refills | Status: AC

## 2022-06-23 NOTE — Progress Notes (Addendum)
  Subjective:     Patient ID: Carrie Santos, female   DOB: [birth year], 57 y.o.   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 She also has vaginal dryness and pain with sex. Pain about 5/10.  Last pap was negative HPV,NILM 10/20/20  PCP is Dr Doren Custard.  Review of Systems +RLQ pain +vaginal dryness    +pain with sex Reviewed past medical,surgical, social and family history. Reviewed medications and allergies.  Objective:   Physical Exam BP 134/85 (BP Location: Right Arm, Patient Position: Sitting, Cuff Size: Large)   Pulse 90   Ht 5\' 6"  (1.676 m)   Wt 216 lb 8 oz (98.2 kg)   LMP 06/05/2016 (Exact Date)   BMI 34.94 kg/m   Skin warm and dry.  Lungs: clear to ausculation bilaterally. Cardiovascular: regular rate and rhythm.  She declines exam today.  Upstream - 06/23/22 1034       Pregnancy Intention Screening   Does the patient want to become pregnant in the next year? N/A    Does the patient's partner want to become pregnant in the next year? N/A    Would the patient like to discuss contraceptive options today? N/A      Contraception Wrap Up   Current Method No Method - Other Reason   postmenopausal   Reason for No Current Contraceptive Method at Intake ([facility] Only) Other    End Method No Method - Other Reason   postmenopausal   Contraception Counseling Provided No                Assessment:     1. RLQ abdominal pain She wants to get [place] when on off from work Scheduled pelvic US for July 19 in office  - US PELVIC COMPLETE WITH TRANSVAGINAL; Future She request refill on Motrin 800 mg   2. Fibroids Will get pelvic US 10/07/22  - US PELVIC COMPLETE WITH TRANSVAGINAL; Future  3. Vaginal dryness Will try premarin vaginal cream Meds ordered this encounter  Medications   ibuprofen (ADVIL) 800 MG tablet    Sig: Take 1 tablet (800 mg total) by mouth  every 8 (eight) hours as needed.    Dispense:  90 tablet    Refill:  1    Order Specific Question:   Supervising Provider    Answer:   Elonda Husky, LUTHER H [[id number]]   conjugated estrogens (PREMARIN) vaginal cream    Sig: Use 0.5 gm in vagina at bedtime for 2 weeks then 2-3 x weekly    Dispense:  42.5 g    Refill:  2    Order Specific Question:   Supervising Provider    Answer:   Elonda Husky, LUTHER H [[id number]]     4. Dyspareunia in female Will start PVC, and add astroglide to use with sex  5. Enlarged uterus Was about 16+ weeks  on exam last year     Plan:     Return October 07, 2022 for pelvic US

## 2022-07-13 ENCOUNTER — Encounter: Payer: Self-pay | Admitting: Family Medicine

## 2022-07-13 ENCOUNTER — Other Ambulatory Visit: Payer: Self-pay

## 2022-07-13 ENCOUNTER — Telehealth: Payer: Self-pay | Admitting: Internal Medicine

## 2022-07-13 ENCOUNTER — Ambulatory Visit: Payer: BC Managed Care – PPO | Admitting: Family Medicine

## 2022-07-13 ENCOUNTER — Other Ambulatory Visit: Payer: Self-pay | Admitting: Family Medicine

## 2022-07-13 VITALS — BP 137/87 | HR 95 | Ht 66.0 in | Wt 211.0 lb

## 2022-07-13 DIAGNOSIS — Z113 Encounter for screening for infections with a predominantly sexual mode of transmission: Secondary | ICD-10-CM

## 2022-07-13 MED ORDER — SEMAGLUTIDE (1 MG/DOSE) 4 MG/3ML ~~LOC~~ SOPN: 1.0000 mg | PEN_INJECTOR | SUBCUTANEOUS | 0 refills | Status: DC

## 2022-07-13 MED ORDER — WEGOVY 1 MG/0.5ML ~~LOC~~ SOAJ: 1.0000 mg | SUBCUTANEOUS | 0 refills | Status: DC

## 2022-07-13 NOTE — Telephone Encounter (Signed)
Sent!

## 2022-07-13 NOTE — Addendum Note (Signed)
Addended by: Telford Nab on: 07/13/2022 03:47 PM   Modules accepted: Orders

## 2022-07-13 NOTE — Progress Notes (Signed)
Acute Office Visit  Subjective:    Patient ID: Carrie Santos, female    DOB: 10/08/1965, 57 y.o.   MRN: [id number]  Chief Complaint  Patient presents with  . Screening STD    No exposure    HPI Patient is in today for ***  Past Medical History:  Diagnosis Date  . Anxiety 09/21/2015  . Depression   . Enlarged uterus 12/02/2014  . Hot flashes 12/02/2014  . Hyperlipidemia   . Hypertension   . RLQ abdominal pain 12/02/2014    Past Surgical History:  Procedure Laterality Date  . KNEE SURGERY Left     Family History  Problem Relation Age of Onset  . Depression Mother   . Hypertension Mother   . Hypertension Father   . Hyperlipidemia Father   . Hyperlipidemia Brother   . Hypertension Brother   . Hyperlipidemia Brother   . Hypertension Brother   . Hyperlipidemia Brother   . Hypertension Brother   . Hyperlipidemia Brother   . Hypertension Brother   . Hyperlipidemia Brother   . Hypertension Brother     Social History   Socioeconomic History  . Marital status: Divorced    Spouse name: Not on file  . Number of children: Not on file  . Years of education: Not on file  . Highest education level: Not on file  Occupational History  . Not on file  Tobacco Use  . Smoking status: Never  . Smokeless tobacco: Never  Vaping Use  . Vaping Use: Never used  Substance and Sexual Activity  . Alcohol use: Yes    Comment: occ  . Drug use: No  . Sexual activity: Yes    Birth control/protection: Post-menopausal  Other Topics Concern  . Not on file  Social History Narrative  . Not on file   Social Determinants of Health   Financial Resource Strain: Low Risk  (12/13/2021)   Overall Financial Resource Strain (CARDIA)   . Difficulty of Paying Living Expenses: Not hard at all  Food Insecurity: No Food Insecurity (12/13/2021)   Hunger Vital Sign   . Worried About Programme researcher, broadcasting/film/video in the Last Year: Never true   . Ran Out of Food in the Last Year: Never true   Transportation Needs: No Transportation Needs (12/13/2021)   PRAPARE - Transportation   . Lack of Transportation (Medical): No   . Lack of Transportation (Non-Medical): No  Physical Activity: Insufficiently Active (12/13/2021)   Exercise Vital Sign   . Days of Exercise per Week: 4 days   . Minutes of Exercise per Session: 30 min  Stress: No Stress Concern Present (12/13/2021)   [employer] of Occupational Health - Occupational Stress Questionnaire   . Feeling of Stress : Only a little  Social Connections: Moderately Isolated (12/13/2021)   Social Connection and Isolation Panel [NHANES]   . Frequency of Communication with Friends and Family: More than three times a week   . Frequency of Social Gatherings with Friends and Family: Twice a week   . Attends Religious Services: More than 4 times per year   . Active Member of Clubs or Organizations: No   . Attends Banker Meetings: Never   . Marital Status: Divorced  Catering manager Violence: Not At Risk (12/13/2021)   Humiliation, Afraid, Rape, and Kick questionnaire   . Fear of Current or Ex-Partner: No   . Emotionally Abused: No   . Physically Abused: No   . Sexually Abused: No  Outpatient Medications Prior to Visit  Medication Sig Dispense Refill  . atorvastatin (LIPITOR) 40 MG tablet Take 0.5 tablets (20 mg total) by mouth every evening. 90 tablet 1  . BIOTIN PO Take by mouth.    . cholecalciferol (VITAMIN D) 1000 UNITS tablet Take 5,000 Units by mouth daily.    Marland Kitchen conjugated estrogens (PREMARIN) vaginal cream Use 0.5 gm in vagina at bedtime for 2 weeks then 2-3 x weekly 42.5 g 2  . Cyanocobalamin (VITAMIN B12 PO) Take by mouth.    . fluticasone (FLONASE) 50 MCG/ACT nasal spray two sprays by Both Nostrils route daily.    Marland Kitchen ibuprofen (ADVIL) 800 MG tablet Take 1 tablet (800 mg total) by mouth every 8 (eight) hours as needed. 90 tablet 1  . lisinopril-hydrochlorothiazide (PRINZIDE,ZESTORETIC) 10-12.5 MG tablet Take 1  tablet by mouth daily.   0  . Omega-3 Fatty Acids (OMEGA-3 FISH OIL PO) Take by mouth.    . Semaglutide-Weight Management (WEGOVY) 1 MG/0.5ML SOAJ Inject 1 mg into the skin once a week. 2 mL 0  . Semaglutide-Weight Management 0.5 MG/0.5ML SOAJ Inject 0.5 mg into the skin once a week for 28 days. 2 mL 0  . sennosides-docusate sodium (SENOKOT-S) 8.6-50 MG tablet Take 1 tablet by mouth daily.     No facility-administered medications prior to visit.    Allergies  Allergen Reactions  . Macrobid [Nitrofurantoin Monohyd Macro] Shortness Of Breath    Review of Systems     Objective:    Physical Exam  BP 137/87   Pulse 95   Ht  (1.676 m)   Wt 211 lb (95.7 kg)   LMP 06/05/2016 (Exact Date)   SpO2 97%   BMI 34.06 kg/m  Wt Readings from Last 3 Encounters:  07/13/22 211 lb (95.7 kg)  06/23/22 216 lb 8 oz (98.2 kg)  06/20/22 209 lb (94.8 kg)       Assessment & Plan:  Screening for STD (sexually transmitted disease) -     NuSwab Vaginitis Plus (VG+)    Gilmore Laroche, FNP

## 2022-07-13 NOTE — Telephone Encounter (Signed)
Pt states that she is on the last shot of  Semaglutide-Weight Management 0.5 MG/0.5ML SOAJ  Wants to know if dosage can be upped. Wants a call back

## 2022-07-13 NOTE — Patient Instructions (Addendum)
I appreciate the opportunity to provide care to you today!    Follow up:  Dr. Durwin Nora as needed  Will let you know the results of your Nuswab examination     Please continue to a heart-healthy diet and increase your physical activities. Try to exercise for at least five days a week.      It was a pleasure to see you and I look forward to continuing to work together on your health and well-being. Please do not hesitate to call the office if you need care or have questions about your care.   Have a wonderful day and week. With Gratitude, Gilmore Laroche MSN, FNP-BC

## 2022-07-14 DIAGNOSIS — Z113 Encounter for screening for infections with a predominantly sexual mode of transmission: Secondary | ICD-10-CM | POA: Insufficient documentation

## 2022-07-14 NOTE — Assessment & Plan Note (Signed)
Pending NuSwab

## 2022-07-16 LAB — NUSWAB VAGINITIS PLUS (VG+)
Neisseria gonorrhoeae, NAA: NEGATIVE
Trich vag by NAA: NEGATIVE

## 2022-07-17 LAB — NUSWAB VAGINITIS PLUS (VG+): Chlamydia trachomatis, NAA: NEGATIVE

## 2022-07-18 ENCOUNTER — Other Ambulatory Visit: Payer: Self-pay | Admitting: Family Medicine

## 2022-07-18 DIAGNOSIS — B9689 Other specified bacterial agents as the cause of diseases classified elsewhere: Secondary | ICD-10-CM

## 2022-07-18 MED ORDER — METRONIDAZOLE 500 MG PO TABS
500.0000 mg | ORAL_TABLET | Freq: Two times a day (BID) | ORAL | 0 refills | Status: AC
Start: 2022-07-18 — End: 2022-07-25

## 2022-07-18 NOTE — Progress Notes (Unsigned)
fla 

## 2022-08-25 ENCOUNTER — Encounter: Payer: Self-pay | Admitting: Adult Health

## 2022-09-21 ENCOUNTER — Ambulatory Visit (INDEPENDENT_AMBULATORY_CARE_PROVIDER_SITE_OTHER): Payer: Managed Care, Other (non HMO) | Admitting: Internal Medicine

## 2022-09-21 ENCOUNTER — Encounter: Payer: Self-pay | Admitting: Internal Medicine

## 2022-09-21 VITALS — BP 122/82 | HR 96 | Ht 66.0 in | Wt 217.0 lb

## 2022-09-21 DIAGNOSIS — E669 Obesity, unspecified: Secondary | ICD-10-CM

## 2022-09-21 DIAGNOSIS — I10 Essential (primary) hypertension: Secondary | ICD-10-CM | POA: Diagnosis not present

## 2022-09-21 NOTE — Progress Notes (Unsigned)
Established Patient Office Visit  Subjective   Patient ID: Carrie Santos, female    DOB: 22-Dec-1965  Age: 58 y.o. MRN: [id number]  Chief Complaint  Patient presents with   Obesity    Three month follow up    Carrie Santos returns to care today for routine follow-up.  She was last evaluated by me for routine care on 3/27.  In interim she has been seen for acute visits on 2 occasions and has establish care with gynecology.  She requested to discontinue amlodipine at her last appointment as her blood pressure remained well-controlled and she did not want to take more than one medication if possible.  Will give he was also increased to 0.5 mg weekly.  Carrie Santos reports feeling well today.  She is asymptomatic and has no acute concerns discussed.  She reports that she has switched jobs and is not currently taking Wegovy because it is not covered by her insurance.  Past Medical History:  Diagnosis Date   Anxiety 09/21/2015   Depression    Enlarged uterus 12/02/2014   Hot flashes 12/02/2014   Hyperlipidemia    Hypertension    RLQ abdominal pain 12/02/2014   Past Surgical History:  Procedure Laterality Date   KNEE SURGERY Left    Social History   Tobacco Use   Smoking status: Never   Smokeless tobacco: Never  Vaping Use   Vaping Use: Never used  Substance Use Topics   Alcohol use: Yes    Comment: occ   Drug use: No   Family History  Problem Relation Age of Onset   Depression Mother    Hypertension Mother    Hypertension Father    Hyperlipidemia Father    Hyperlipidemia Brother    Hypertension Brother    Hyperlipidemia Brother    Hypertension Brother    Hyperlipidemia Brother    Hypertension Brother    Hyperlipidemia Brother    Hypertension Brother    Hyperlipidemia Brother    Hypertension Brother    Allergies  Allergen Reactions   Macrobid [Nitrofurantoin Monohyd Macro] Shortness Of Breath   Review of Systems  Constitutional:  Negative for chills and fever.  HENT:   Negative for sore throat.   Respiratory:  Negative for cough and shortness of breath.   Cardiovascular:  Negative for chest pain, palpitations and leg swelling.  Gastrointestinal:  Negative for abdominal pain, blood in stool, constipation, diarrhea, nausea and vomiting.  Genitourinary:  Negative for dysuria and hematuria.  Musculoskeletal:  Negative for myalgias.  Skin:  Negative for itching and rash.  Neurological:  Negative for dizziness and headaches.  Psychiatric/Behavioral:  Negative for depression and suicidal ideas.      Objective:     BP 122/82   Pulse 96   Ht 5\' 6"  (1.676 m)   Wt 217 lb (98.4 kg)   LMP 06/05/2016 (Exact Date)   SpO2 97%   BMI 35.02 kg/m  BP Readings from Last 3 Encounters:  09/21/22 122/82  07/13/22 137/87  06/23/22 134/85   Physical Exam Vitals reviewed.  Constitutional:      General: She is not in acute distress.    Appearance: Normal appearance. She is obese. She is not toxic-appearing.  HENT:     Head: Normocephalic and atraumatic.     Right Ear: External ear normal.     Left Ear: External ear normal.     Nose: Nose normal. No congestion or rhinorrhea.     Mouth/Throat:  Mouth: Mucous membranes are moist.     Pharynx: Oropharynx is clear. No oropharyngeal exudate or posterior oropharyngeal erythema.  Eyes:     General: No scleral icterus.    Extraocular Movements: Extraocular movements intact.     Conjunctiva/sclera: Conjunctivae normal.     Pupils: Pupils are equal, round, and reactive to light.  Cardiovascular:     Rate and Rhythm: Normal rate and regular rhythm.     Pulses: Normal pulses.     Heart sounds: Normal heart sounds. No murmur heard.    No friction rub. No gallop.  Pulmonary:     Effort: Pulmonary effort is normal.     Breath sounds: Normal breath sounds. No wheezing, rhonchi or rales.  Abdominal:     General: Abdomen is flat. Bowel sounds are normal. There is no distension.     Palpations: Abdomen is soft.      Tenderness: There is no abdominal tenderness.  Musculoskeletal:        General: No swelling. Normal range of motion.     Cervical back: Normal range of motion.     Right lower leg: No edema.     Left lower leg: No edema.  Lymphadenopathy:     Cervical: No cervical adenopathy.  Skin:    General: Skin is warm and dry.     Capillary Refill: Capillary refill takes less than 2 seconds.     Coloration: Skin is not jaundiced.  Neurological:     General: No focal deficit present.     Mental Status: She is alert and oriented to person, place, and time.  Psychiatric:        Mood and Affect: Mood normal.        Behavior: Behavior normal.   Last CBC Lab Results  Component Value Date   WBC 10.3 06/15/2022   HGB 13.0 06/15/2022   HCT 40.4 06/15/2022   MCV 91 06/15/2022   MCH 29.4 06/15/2022   RDW 12.5 06/15/2022   PLT 402 06/15/2022   Last metabolic panel Lab Results  Component Value Date   GLUCOSE 103 (H) 06/15/2022   NA 142 06/15/2022   K 3.9 06/15/2022   CL 103 06/15/2022   CO2 22 06/15/2022   BUN 12 06/15/2022   CREATININE 0.68 06/15/2022   EGFR 101 06/15/2022   CALCIUM 9.9 06/15/2022   PROT 7.0 06/15/2022   ALBUMIN 4.2 06/15/2022   LABGLOB 2.8 06/15/2022   AGRATIO 1.5 06/15/2022   BILITOT 0.2 06/15/2022   ALKPHOS 85 06/15/2022   AST 17 06/15/2022   ALT 17 06/15/2022   Last lipids Lab Results  Component Value Date   CHOL 183 06/15/2022   HDL 47 06/15/2022   LDLCALC 105 (H) 06/15/2022   TRIG 181 (H) 06/15/2022   CHOLHDL 3.9 06/15/2022   Last hemoglobin A1c Lab Results  Component Value Date   HGBA1C 5.2 06/15/2022   Last thyroid functions Lab Results  Component Value Date   TSH 0.671 06/15/2022   Last vitamin D Lab Results  Component Value Date   VD25OH 57.0 06/15/2022   Last vitamin B12 and Folate Lab Results  Component Value Date   VITAMINB12 >2000 (H) 06/15/2022   FOLATE 7.9 06/15/2022   The 10-year ASCVD risk score (Arnett DK, et al., 2019) is:  5.4%    Assessment & Plan:   Problem List Items Addressed This Visit       Essential hypertension - Primary    BP today is 122/82.  She has previously  requested to stop taking amlodipine, but reports that she has actually continued to take amlodipine in addition to lisinopril-HCTZ. -Carrie Santos reports that she will continue amlodipine as previously prescribed as her blood pressure remains well-controlled.      Obesity (BMI 30.0-34.9)    She stopped taking Wegovy since her last appointment when it was no longer covered by her insurance.  She remains interested in medication options for weight loss.  She has recently changed jobs and plans to contact her insurance company to see if they will cover Wegovy or Zepbound.  We also discussed Qsymia.  Carrie Santos continues to focus on lifestyle modifications aimed at weight loss as well.      Return in about 6 months (around 03/24/2023).   Billie Lade, MD

## 2022-09-21 NOTE — Patient Instructions (Signed)
It was a pleasure to see you today.  Thank you for giving [place] the opportunity to be involved in your care.  Below is a brief recap of your visit and next steps.  We will plan to see you again in 6 months.  Summary No medication changes today See if your insurance company will cover Wegovy or Zepbound We will plan for follow up in 6 months

## 2022-09-22 ENCOUNTER — Encounter: Payer: Self-pay | Admitting: Internal Medicine

## 2022-09-22 NOTE — Assessment & Plan Note (Signed)
BP today is 122/82.  She has previously requested to stop taking amlodipine, but reports that she has actually continued to take amlodipine in addition to lisinopril-HCTZ. -Carrie Santos reports that she will continue amlodipine as previously prescribed as her blood pressure remains well-controlled.

## 2022-09-22 NOTE — Assessment & Plan Note (Signed)
She stopped taking Wegovy since her last appointment when it was no longer covered by her insurance.  She remains interested in medication options for weight loss.  She has recently changed jobs and plans to contact her insurance company to see if they will cover Wegovy or Zepbound.  We also discussed Qsymia.  Carrie Santos continues to focus on lifestyle modifications aimed at weight loss as well.

## 2022-09-23 ENCOUNTER — Encounter: Payer: Self-pay | Admitting: Internal Medicine

## 2022-10-07 ENCOUNTER — Other Ambulatory Visit: Payer: BC Managed Care – PPO

## 2022-10-14 ENCOUNTER — Telehealth: Payer: Self-pay | Admitting: Internal Medicine

## 2022-10-14 DIAGNOSIS — E669 Obesity, unspecified: Secondary | ICD-10-CM

## 2022-10-14 MED ORDER — QSYMIA 3.75-23 MG PO CP24
1.0000 | ORAL_CAPSULE | Freq: Every day | ORAL | 0 refills | Status: DC
Start: 2022-10-14 — End: 2023-06-01

## 2022-10-14 NOTE — Telephone Encounter (Signed)
Pt called in wants a cll back . Wants to know if she can try Ozempic instead of [facility]

## 2022-10-17 NOTE — Telephone Encounter (Signed)
Spoke with patient.

## 2022-11-04 NOTE — Telephone Encounter (Signed)
Patient came by the office both insurance cards are uploaded scanned into patient documents demographics.  Patient asked to retry with other insurance to get approval to cover these medicines.

## 2022-11-23 ENCOUNTER — Other Ambulatory Visit: Payer: Self-pay | Admitting: Internal Medicine

## 2022-11-23 ENCOUNTER — Telehealth: Payer: Self-pay | Admitting: Internal Medicine

## 2022-11-23 MED ORDER — AMLODIPINE BESYLATE 5 MG PO TABS: 5.0000 mg | ORAL_TABLET | Freq: Every day | ORAL | 0 refills | Status: DC

## 2022-11-23 MED ORDER — LISINOPRIL-HYDROCHLOROTHIAZIDE 10-12.5 MG PO TABS: 1.0000 | ORAL_TABLET | Freq: Every day | ORAL | 0 refills | Status: DC

## 2022-11-23 NOTE — Telephone Encounter (Signed)
error 

## 2023-01-17 ENCOUNTER — Other Ambulatory Visit: Payer: Self-pay | Admitting: Internal Medicine

## 2023-01-17 DIAGNOSIS — Z1231 Encounter for screening mammogram for malignant neoplasm of breast: Secondary | ICD-10-CM

## 2023-01-30 NOTE — Progress Notes (Unsigned)
   Established Patient Office Visit   Subjective  Patient ID: Carrie Santos, female    DOB: 06-05-1965  Age: 57 y.o. MRN: [id number]  No chief complaint on file.   She  has a past medical history of Anxiety (09/21/2015), Depression, Enlarged uterus (12/02/2014), Hot flashes (12/02/2014), Hyperlipidemia, Hypertension, and RLQ abdominal pain (12/02/2014).  Patient complains of {pneum rfv:14244}. Patient describes symptoms of {pneumonia symptoms:12520}. Symptoms began {0-10:33138} {unit:11::"days"} ago and are {course:17::"unchanged"} since that time. Patient denies {pneumonia denies:14121}. Treatment thus far includes {pneumonia tx to date:14122} Past pulmonary history is significant for {resp history:412}     ROS    Objective:     LMP 06/05/2016 (Exact Date)  {Vitals History (Optional):23777}  Physical Exam   No results found for any visits on 01/31/23.  The 10-year ASCVD risk score (Arnett DK, et al., 2019) is: 5.4%    Assessment & Plan:  There are no diagnoses linked to this encounter.  No follow-ups on file.   Cruzita Lederer Newman Nip, FNP

## 2023-01-31 ENCOUNTER — Encounter: Payer: Self-pay | Admitting: Family Medicine

## 2023-01-31 ENCOUNTER — Ambulatory Visit (INDEPENDENT_AMBULATORY_CARE_PROVIDER_SITE_OTHER): Payer: Managed Care, Other (non HMO) | Admitting: Family Medicine

## 2023-01-31 VITALS — BP 136/84 | HR 92 | Ht 66.0 in | Wt 218.0 lb

## 2023-01-31 DIAGNOSIS — T50Z95A Adverse effect of other vaccines and biological substances, initial encounter: Secondary | ICD-10-CM | POA: Diagnosis not present

## 2023-01-31 DIAGNOSIS — R053 Chronic cough: Secondary | ICD-10-CM

## 2023-01-31 MED ORDER — BENZONATATE 200 MG PO CAPS: 200.0000 mg | ORAL_CAPSULE | Freq: Two times a day (BID) | ORAL | 0 refills | Status: DC | PRN

## 2023-01-31 MED ORDER — PREDNISONE 20 MG PO TABS: 20.0000 mg | ORAL_TABLET | Freq: Two times a day (BID) | ORAL | 0 refills | Status: AC

## 2023-01-31 NOTE — Assessment & Plan Note (Signed)
Flu vaccine-associated cough and fatigue Prednisone 20 mg twice day x 5 days , Benzonatate 200 mg PRN,  Advise patient to rest to support your body's recovery. Stay hydrated by drinking water, tea, or broth. Using a humidifier can help soothe throat irritation and ease nasal congestion. For fever or pain, acetaminophen (Tylenol) is recommended. To relieve other symptoms, try saline nasal sprays, throat lozenges, or gargling with saltwater. Focus on eating light, healthy meals like fruits and vegetables to keep your strength up. Practice good hygiene by washing your hands frequently and covering your mouth when coughing or sneezing.Follow-up for worsening or persistent symptoms. Patient verbalizes understanding regarding plan of care and all questions answered

## 2023-02-17 ENCOUNTER — Other Ambulatory Visit: Payer: Self-pay | Admitting: Adult Health

## 2023-03-06 ENCOUNTER — Other Ambulatory Visit: Payer: Self-pay | Admitting: Internal Medicine

## 2023-03-27 ENCOUNTER — Ambulatory Visit: Payer: Managed Care, Other (non HMO) | Admitting: Internal Medicine

## 2023-04-12 ENCOUNTER — Telehealth: Payer: Self-pay

## 2023-04-12 NOTE — Telephone Encounter (Signed)
Patient scheduled for 04/12/2023

## 2023-04-12 NOTE — Telephone Encounter (Signed)
Copied from CRM [id number]. Topic: Appointments - Appointment Cancel/Reschedule >> Apr 12, 2023 11:24 AM Dimitri Ped wrote: Patient/patient representative is calling to cancel or reschedule an appointment. Refer to attachments for appointment information. Patient wanted to reschedule appointment but there where no  appointments available before February. Did add appointment to waitlist . Patient is wanting a earlier appointment because she is not feeling well and wants  be seen soon . Patient is requesting anytime or any day that's comes available before February 18th

## 2023-04-21 ENCOUNTER — Encounter: Payer: Self-pay | Admitting: Internal Medicine

## 2023-04-21 ENCOUNTER — Ambulatory Visit (INDEPENDENT_AMBULATORY_CARE_PROVIDER_SITE_OTHER): Payer: Managed Care, Other (non HMO) | Admitting: Internal Medicine

## 2023-04-21 ENCOUNTER — Ambulatory Visit: Payer: Managed Care, Other (non HMO) | Admitting: Internal Medicine

## 2023-04-21 VITALS — BP 118/75 | HR 101 | Ht 66.0 in | Wt 221.4 lb

## 2023-04-21 DIAGNOSIS — R000 Tachycardia, unspecified: Secondary | ICD-10-CM | POA: Diagnosis not present

## 2023-04-21 DIAGNOSIS — E669 Obesity, unspecified: Secondary | ICD-10-CM | POA: Diagnosis not present

## 2023-04-21 DIAGNOSIS — E782 Mixed hyperlipidemia: Secondary | ICD-10-CM

## 2023-04-21 DIAGNOSIS — I10 Essential (primary) hypertension: Secondary | ICD-10-CM | POA: Diagnosis not present

## 2023-04-21 MED ORDER — METOPROLOL SUCCINATE ER 25 MG PO TB24: 12.5000 mg | ORAL_TABLET | Freq: Every day | ORAL | 2 refills | Status: DC

## 2023-04-21 NOTE — Assessment & Plan Note (Signed)
BP today is 118/75.  She is currently prescribed amlodipine 5 mg daily and lisinopril-HCTZ 10-12.5 mg daily.  As otherwise documented, she endorses symptomatic tachycardia and low-dose Toprol-XL has been prescribed.  With this in mind, amlodipine has been discontinued.  She will return to care in 6 weeks for reassessment.

## 2023-04-21 NOTE — Assessment & Plan Note (Signed)
Her acute concern today is tachycardia.  Her Apple Watch has alerted her to elevated heart rates mostly in the afternoons.  She states that her heart rate will reach 100-110s.  HR 101 on intake and 98 when assessed during her encounter.  She describes feelings of restlessness when her heart rate is elevated.  Denies chest pain, dizziness, shortness of breath, syncope/presyncope, weakness, and diaphoresis.  ECG obtained during today's encounter shows sinus tachycardia.  Interestingly, Carrie Santos was evaluated by cardiology in June 2022 endorsing symptoms that are very similar to what she describes today.  She completed a cardiac monitor that showed sinus rhythm, sinus tachycardia.  No evidence of arrhythmias.  Average heart rate 98 bpm.  Per cardiology, recommend adequate hydration.  Can add low-dose metoprolol if patient is feeling uncomfortable. -Current symptoms and concerns as well as previous cardiac workup were reviewed with the patient today.  Through shared decision making, Toprol-XL 12.5 mg daily has been started.  She will return to care in 6 weeks for reassessment.

## 2023-04-21 NOTE — Progress Notes (Signed)
Established Patient Office Visit  Subjective   Patient ID: Carrie Santos, female    DOB: 1965-12-31  Age: 58 y.o. MRN: [id number]  Chief Complaint  Patient presents with   Hypertension    Six month follow up, patient states when sitting her heart rate has increased   Weight Loss    Weight loss options   Carrie Santos returns to care today for routine follow-up.  She was last evaluated by me in July 2024.  No medication changes were made at that time and 7-month follow-up was arranged.  In the interim, she was evaluated at [facility] for an acute visit in November a persistent cough that began after receiving her influenza vaccine.  Treated with prednisone x 5 days.  There have otherwise been no acute interval events.  Today her acute concern is tachycardia.  Her Apple Watch has alerted her that her heart rate has been in the low 100s multiple times recently.  HR 101 on intake today, 98 when checked during our encounter.  She states that it causes her to feel restless.  Denies palpitations, dizziness, shortness of breath, syncope/presyncope, and diaphoresis.  Heart rates are typically elevated in the afternoon.  She would like to discuss treatment options.  Past Medical History:  Diagnosis Date   Anxiety 09/21/2015   Depression    Enlarged uterus 12/02/2014   Hot flashes 12/02/2014   Hyperlipidemia    Hypertension    RLQ abdominal pain 12/02/2014   Past Surgical History:  Procedure Laterality Date   KNEE SURGERY Left    Social History   Tobacco Use   Smoking status: Never   Smokeless tobacco: Never  Vaping Use   Vaping status: Never Used  Substance Use Topics   Alcohol use: Yes    Comment: occ   Drug use: No   Family History  Problem Relation Age of Onset   Depression Mother    Hypertension Mother    Hypertension Father    Hyperlipidemia Father    Hyperlipidemia Brother    Hypertension Brother    Hyperlipidemia Brother    Hypertension Brother    Hyperlipidemia Brother     Hypertension Brother    Hyperlipidemia Brother    Hypertension Brother    Hyperlipidemia Brother    Hypertension Brother    Allergies  Allergen Reactions   Macrobid [Nitrofurantoin Monohyd Macro] Shortness Of Breath   Review of Systems  Constitutional:  Negative for chills and fever.       Restlessness when heart rate is elevated  HENT:  Negative for sore throat.   Respiratory:  Negative for cough and shortness of breath.   Cardiovascular:  Negative for chest pain, palpitations and leg swelling.  Gastrointestinal:  Negative for abdominal pain, blood in stool, constipation, diarrhea, nausea and vomiting.  Genitourinary:  Negative for dysuria and hematuria.  Musculoskeletal:  Negative for myalgias.  Skin:  Negative for itching and rash.  Neurological:  Negative for dizziness and headaches.  Psychiatric/Behavioral:  Negative for depression and suicidal ideas.      Objective:     BP 118/75 (BP Location: Left Arm, Patient Position: Sitting, Cuff Size: Large)   Pulse (!) 101   Ht 5\' 6"  (1.676 m)   Wt 221 lb 6.4 oz (100.4 kg)   LMP 06/05/2016 (Exact Date)   SpO2 94%   BMI 35.73 kg/m  BP Readings from Last 3 Encounters:  04/21/23 118/75  01/31/23 136/84  09/21/22 122/82   Physical Exam Vitals reviewed.  Constitutional:      General: She is not in acute distress.    Appearance: Normal appearance. She is obese. She is not toxic-appearing.  HENT:     Head: Normocephalic and atraumatic.     Right Ear: External ear normal.     Left Ear: External ear normal.     Nose: Nose normal. No congestion or rhinorrhea.     Mouth/Throat:     Mouth: Mucous membranes are moist.     Pharynx: Oropharynx is clear. No oropharyngeal exudate or posterior oropharyngeal erythema.  Eyes:     General: No scleral icterus.    Extraocular Movements: Extraocular movements intact.     Conjunctiva/sclera: Conjunctivae normal.     Pupils: Pupils are equal, round, and reactive to light.  Cardiovascular:      Rate and Rhythm: Regular rhythm. Tachycardia present.     Pulses: Normal pulses.     Heart sounds: Normal heart sounds. No murmur heard.    No friction rub. No gallop.  Pulmonary:     Effort: Pulmonary effort is normal.     Breath sounds: Normal breath sounds. No wheezing, rhonchi or rales.  Abdominal:     General: Abdomen is flat. Bowel sounds are normal. There is no distension.     Palpations: Abdomen is soft.     Tenderness: There is no abdominal tenderness.  Musculoskeletal:        General: No swelling. Normal range of motion.     Cervical back: Normal range of motion.     Right lower leg: No edema.     Left lower leg: No edema.  Lymphadenopathy:     Cervical: No cervical adenopathy.  Skin:    General: Skin is warm and dry.     Capillary Refill: Capillary refill takes less than 2 seconds.     Coloration: Skin is not jaundiced.  Neurological:     General: No focal deficit present.     Mental Status: She is alert and oriented to person, place, and time.  Psychiatric:        Mood and Affect: Mood normal.        Behavior: Behavior normal.   Last CBC Lab Results  Component Value Date   WBC 10.3 06/15/2022   HGB 13.0 06/15/2022   HCT 40.4 06/15/2022   MCV 91 06/15/2022   MCH 29.4 06/15/2022   RDW 12.5 06/15/2022   PLT 402 06/15/2022   Last metabolic panel Lab Results  Component Value Date   GLUCOSE 103 (H) 06/15/2022   NA 142 06/15/2022   K 3.9 06/15/2022   CL 103 06/15/2022   CO2 22 06/15/2022   BUN 12 06/15/2022   CREATININE 0.68 06/15/2022   EGFR 101 06/15/2022   CALCIUM 9.9 06/15/2022   PROT 7.0 06/15/2022   ALBUMIN 4.2 06/15/2022   LABGLOB 2.8 06/15/2022   AGRATIO 1.5 06/15/2022   BILITOT 0.2 06/15/2022   ALKPHOS 85 06/15/2022   AST 17 06/15/2022   ALT 17 06/15/2022   Last lipids Lab Results  Component Value Date   CHOL 183 06/15/2022   HDL 47 06/15/2022   LDLCALC 105 (H) 06/15/2022   TRIG 181 (H) 06/15/2022   CHOLHDL 3.9 06/15/2022   Last  hemoglobin A1c Lab Results  Component Value Date   HGBA1C 5.2 06/15/2022   Last thyroid functions Lab Results  Component Value Date   TSH 0.671 06/15/2022   Last vitamin D Lab Results  Component Value Date   VD25OH 57.0 06/15/2022  Last vitamin B12 and Folate Lab Results  Component Value Date   VITAMINB12 >2000 (H) 06/15/2022   FOLATE 7.9 06/15/2022   The 10-year ASCVD risk score (Arnett DK, et al., 2019) is: 4.8%    Assessment & Plan:   Problem List Items Addressed This Visit       Essential hypertension   BP today is 118/75.  She is currently prescribed amlodipine 5 mg daily and lisinopril-HCTZ 10-12.5 mg daily.  As otherwise documented, she endorses symptomatic tachycardia and low-dose Toprol-XL has been prescribed.  With this in mind, amlodipine has been discontinued.  She will return to care in 6 weeks for reassessment.      Symptomatic tachycardia   Her acute concern today is tachycardia.  Her Apple Watch has alerted her to elevated heart rates mostly in the afternoons.  She states that her heart rate will reach 100-110s.  HR 101 on intake and 98 when assessed during her encounter.  She describes feelings of restlessness when her heart rate is elevated.  Denies chest pain, dizziness, shortness of breath, syncope/presyncope, weakness, and diaphoresis.  ECG obtained during today's encounter shows sinus tachycardia.  Interestingly, Ms. Holcomb was evaluated by cardiology in June 2022 endorsing symptoms that are very similar to what she describes today.  She completed a cardiac monitor that showed sinus rhythm, sinus tachycardia.  No evidence of arrhythmias.  Average heart rate 98 bpm.  Per cardiology, recommend adequate hydration.  Can add low-dose metoprolol if patient is feeling uncomfortable. -Current symptoms and concerns as well as previous cardiac workup were reviewed with the patient today.  Through shared decision making, Toprol-XL 12.5 mg daily has been started.  She  will return to care in 6 weeks for reassessment.      Return in about 6 weeks (around 06/02/2023).   Billie Lade, MD

## 2023-04-21 NOTE — Patient Instructions (Signed)
It was a pleasure to see you today.  Thank you for giving [place] the opportunity to be involved in your care.  Below is a brief recap of your visit and next steps.  We will plan to see you again in 6 weeks.  Summary START Toprol XL 12.5 mg (half tablet) daily STOP amlodipine 5 mg daily Repeat labs Follow up in 6 weeks

## 2023-04-22 LAB — LIPID PANEL
Chol/HDL Ratio: 3.5 {ratio} (ref 0.0–4.4)
Cholesterol, Total: 180 mg/dL (ref 100–199)
HDL: 52 mg/dL (ref >39)
LDL Chol Calc (NIH): 109 mg/dL — ABNORMAL HIGH (ref 0–99)
Triglycerides: 103 mg/dL (ref 0–149)
VLDL Cholesterol Cal: 19 mg/dL (ref 5–40)

## 2023-04-22 LAB — CMP14+EGFR
ALT: 17 [IU]/L (ref 0–32)
AST: 18 [IU]/L (ref 0–40)
Albumin: 4.2 g/dL (ref 3.8–4.9)
Alkaline Phosphatase: 75 [IU]/L (ref 44–121)
BUN/Creatinine Ratio: 14 (ref 9–23)
BUN: 9 mg/dL (ref 6–24)
Bilirubin Total: 0.3 mg/dL (ref 0.0–1.2)
CO2: 22 mmol/L (ref 20–29)
Calcium: 9.4 mg/dL (ref 8.7–10.2)
Chloride: 104 mmol/L (ref 96–106)
Creatinine, Ser: 0.63 mg/dL (ref 0.57–1.00)
Globulin, Total: 2.8 g/dL (ref 1.5–4.5)
Glucose: 90 mg/dL (ref 70–99)
Potassium: 3.8 mmol/L (ref 3.5–5.2)
Sodium: 140 mmol/L (ref 134–144)
Total Protein: 7 g/dL (ref 6.0–8.5)
eGFR: 103 mL/min/{1.73_m2} (ref >59)

## 2023-04-22 LAB — CBC WITH DIFFERENTIAL/PLATELET
Basophils Absolute: 0 10*3/uL (ref 0.0–0.2)
Basos: 1 %
EOS (ABSOLUTE): 0.1 10*3/uL (ref 0.0–0.4)
Eos: 1 %
Hematocrit: 38.7 % (ref 34.0–46.6)
Hemoglobin: 11.8 g/dL (ref 11.1–15.9)
Immature Grans (Abs): 0 10*3/uL (ref 0.0–0.1)
Immature Granulocytes: 0 %
Lymphocytes Absolute: 3.6 10*3/uL — ABNORMAL HIGH (ref 0.7–3.1)
Lymphs: 43 %
MCH: 28.3 pg (ref 26.6–33.0)
MCHC: 30.5 g/dL — ABNORMAL LOW (ref 31.5–35.7)
MCV: 93 fL (ref 79–97)
Monocytes Absolute: 0.5 10*3/uL (ref 0.1–0.9)
Monocytes: 6 %
Neutrophils Absolute: 4.1 10*3/uL (ref 1.4–7.0)
Neutrophils: 49 %
Platelets: 386 10*3/uL (ref 150–450)
RBC: 4.17 x10E6/uL (ref 3.77–5.28)
RDW: 12.5 % (ref 11.7–15.4)
WBC: 8.3 10*3/uL (ref 3.4–10.8)

## 2023-04-22 LAB — B12 AND FOLATE PANEL
Folate: 5.6 ng/mL (ref >3.0)
Vitamin B-12: 1895 pg/mL — ABNORMAL HIGH (ref 232–1245)

## 2023-04-22 LAB — TSH+FREE T4
Free T4: 1.21 ng/dL (ref 0.82–1.77)
TSH: 1.05 u[IU]/mL (ref 0.450–4.500)

## 2023-04-22 LAB — HEMOGLOBIN A1C
Est. average glucose Bld gHb Est-mCnc: 97 mg/dL
Hgb A1c MFr Bld: 5 % (ref 4.8–5.6)

## 2023-04-22 LAB — VITAMIN D 25 HYDROXY (VIT D DEFICIENCY, FRACTURES): Vit D, 25-Hydroxy: 66.4 ng/mL (ref 30.0–100.0)

## 2023-04-23 ENCOUNTER — Encounter: Payer: Self-pay | Admitting: Internal Medicine

## 2023-04-27 ENCOUNTER — Other Ambulatory Visit: Payer: Self-pay | Admitting: Internal Medicine

## 2023-05-09 ENCOUNTER — Ambulatory Visit: Payer: Managed Care, Other (non HMO) | Admitting: Internal Medicine

## 2023-06-01 ENCOUNTER — Ambulatory Visit (INDEPENDENT_AMBULATORY_CARE_PROVIDER_SITE_OTHER): Payer: Managed Care, Other (non HMO) | Admitting: Internal Medicine

## 2023-06-01 ENCOUNTER — Encounter: Payer: Self-pay | Admitting: Internal Medicine

## 2023-06-01 VITALS — BP 137/84 | HR 92 | Ht 66.0 in | Wt 219.6 lb

## 2023-06-01 DIAGNOSIS — E669 Obesity, unspecified: Secondary | ICD-10-CM

## 2023-06-01 DIAGNOSIS — R000 Tachycardia, unspecified: Secondary | ICD-10-CM

## 2023-06-01 DIAGNOSIS — E66811 Obesity, class 1: Secondary | ICD-10-CM | POA: Diagnosis not present

## 2023-06-01 DIAGNOSIS — E782 Mixed hyperlipidemia: Secondary | ICD-10-CM | POA: Diagnosis not present

## 2023-06-01 MED ORDER — METOPROLOL SUCCINATE ER 25 MG PO TB24: 25.0000 mg | ORAL_TABLET | Freq: Every day | ORAL | 3 refills | Status: DC

## 2023-06-01 NOTE — Patient Instructions (Signed)
 It was a pleasure to see you today.  Thank you for giving [place] the opportunity to be involved in your care.  Below is a brief recap of your visit and next steps.  We will plan to see you again in 6 months.  Summary Increase Toprol XL to 25 mg daily Medical nutrition therapy referral placed Follow up in 6 months

## 2023-06-01 NOTE — Progress Notes (Signed)
 Established Patient Office Visit  Subjective   Patient ID: Carrie Santos, female    DOB: April 17, 1965  Age: 58 y.o. MRN: [id number]  Chief Complaint  Patient presents with   Care Management    Six week follow up    Carrie Santos returns today for follow-up of symptomatic tachycardia.  She was last evaluated by me on 1/31 at which time she endorsed tachycardia.  She reported that her Apple Watch had alerted her to elevated heart rates and that she experienced feelings of restlessness when her heart rate was elevated.  Previous documentation and cardiac workup were reviewed.  Through shared decision making, Toprol-XL 12.5 mg daily was started.  6-week follow-up was arranged for reassessment.  There have been no acute interval events.  Today she reports feeling well.  She states that symptoms have improved since starting Toprol-XL.  Episodes of tachycardia and restlessness occur less frequently.  Her additional concern today is requesting a referral to medical nutrition therapy to discuss strategies for weight loss.  Past Medical History:  Diagnosis Date   Anxiety 09/21/2015   Depression    Enlarged uterus 12/02/2014   Hot flashes 12/02/2014   Hyperlipidemia    Hypertension    RLQ abdominal pain 12/02/2014   Past Surgical History:  Procedure Laterality Date   KNEE SURGERY Left    Social History   Tobacco Use   Smoking status: Never   Smokeless tobacco: Never  Vaping Use   Vaping status: Never Used  Substance Use Topics   Alcohol use: Yes    Comment: occ   Drug use: No   Family History  Problem Relation Age of Onset   Depression Mother    Hypertension Mother    Hypertension Father    Hyperlipidemia Father    Hyperlipidemia Brother    Hypertension Brother    Hyperlipidemia Brother    Hypertension Brother    Hyperlipidemia Brother    Hypertension Brother    Hyperlipidemia Brother    Hypertension Brother    Hyperlipidemia Brother    Hypertension Brother    Allergies   Allergen Reactions   Macrobid [Nitrofurantoin Monohyd Macro] Shortness Of Breath   Review of Systems  Constitutional:  Negative for chills and fever.  HENT:  Negative for sore throat.   Respiratory:  Negative for cough and shortness of breath.   Cardiovascular:  Negative for chest pain, palpitations and leg swelling.  Gastrointestinal:  Negative for abdominal pain, blood in stool, constipation, diarrhea, nausea and vomiting.  Genitourinary:  Negative for dysuria and hematuria.  Musculoskeletal:  Negative for myalgias.  Skin:  Negative for itching and rash.  Neurological:  Negative for dizziness and headaches.  Psychiatric/Behavioral:  Negative for depression and suicidal ideas.      Objective:     BP 137/84 (BP Location: Left Arm, Patient Position: Sitting, Cuff Size: Normal)   Pulse 92   Ht 5\' 6"  (1.676 m)   Wt 219 lb 9.6 oz (99.6 kg)   LMP 06/05/2016 (Exact Date)   SpO2 96%   BMI 35.44 kg/m  BP Readings from Last 3 Encounters:  06/01/23 137/84  04/21/23 118/75  01/31/23 136/84   Physical Exam Vitals reviewed.  Constitutional:      General: She is not in acute distress.    Appearance: Normal appearance. She is obese. She is not toxic-appearing.  HENT:     Head: Normocephalic and atraumatic.     Right Ear: External ear normal.     Left Ear:  External ear normal.     Nose: Nose normal. No congestion or rhinorrhea.     Mouth/Throat:     Mouth: Mucous membranes are moist.     Pharynx: Oropharynx is clear. No oropharyngeal exudate or posterior oropharyngeal erythema.  Eyes:     General: No scleral icterus.    Extraocular Movements: Extraocular movements intact.     Conjunctiva/sclera: Conjunctivae normal.     Pupils: Pupils are equal, round, and reactive to light.  Cardiovascular:     Rate and Rhythm: Normal rate and regular rhythm.     Pulses: Normal pulses.     Heart sounds: Normal heart sounds. No murmur heard.    No friction rub. No gallop.  Pulmonary:      Effort: Pulmonary effort is normal.     Breath sounds: Normal breath sounds. No wheezing, rhonchi or rales.  Abdominal:     General: Abdomen is flat. Bowel sounds are normal. There is no distension.     Palpations: Abdomen is soft.     Tenderness: There is no abdominal tenderness.  Musculoskeletal:        General: No swelling. Normal range of motion.     Cervical back: Normal range of motion.     Right lower leg: No edema.     Left lower leg: No edema.  Lymphadenopathy:     Cervical: No cervical adenopathy.  Skin:    General: Skin is warm and dry.     Capillary Refill: Capillary refill takes less than 2 seconds.     Coloration: Skin is not jaundiced.  Neurological:     General: No focal deficit present.     Mental Status: She is alert and oriented to person, place, and time.  Psychiatric:        Mood and Affect: Mood normal.        Behavior: Behavior normal.   Last CBC Lab Results  Component Value Date   WBC 8.3 04/21/2023   HGB 11.8 04/21/2023   HCT 38.7 04/21/2023   MCV 93 04/21/2023   MCH 28.3 04/21/2023   RDW 12.5 04/21/2023   PLT 386 04/21/2023   Last metabolic panel Lab Results  Component Value Date   GLUCOSE 90 04/21/2023   NA 140 04/21/2023   K 3.8 04/21/2023   CL 104 04/21/2023   CO2 22 04/21/2023   BUN 9 04/21/2023   CREATININE 0.63 04/21/2023   EGFR 103 04/21/2023   CALCIUM 9.4 04/21/2023   PROT 7.0 04/21/2023   ALBUMIN 4.2 04/21/2023   LABGLOB 2.8 04/21/2023   AGRATIO 1.5 06/15/2022   BILITOT 0.3 04/21/2023   ALKPHOS 75 04/21/2023   AST 18 04/21/2023   ALT 17 04/21/2023   Last lipids Lab Results  Component Value Date   CHOL 180 04/21/2023   HDL 52 04/21/2023   LDLCALC 109 (H) 04/21/2023   TRIG 103 04/21/2023   CHOLHDL 3.5 04/21/2023   Last hemoglobin A1c Lab Results  Component Value Date   HGBA1C 5.0 04/21/2023   Last thyroid functions Lab Results  Component Value Date   TSH 1.050 04/21/2023   Last vitamin D Lab Results   Component Value Date   VD25OH 66.4 04/21/2023   Last vitamin B12 and Folate Lab Results  Component Value Date   VITAMINB12 1,895 (H) 04/21/2023   FOLATE 5.6 04/21/2023   The 10-year ASCVD risk score (Arnett DK, et al., 2019) is: 7.4%    Assessment & Plan:   Problem List Items Addressed This Visit  Obesity (BMI 30.0-34.9)   Current weight 219 pounds.  BMI 35.4.  She was previously prescribed Wegovy but it is no longer covered by insurance.  She prefers to focus on lifestyle modifications aimed at weight loss and would like to establish care with medical nutrition therapy to discuss appropriate dietary changes aimed at weight loss.  Medical nutrition therapy referral placed today.      Symptomatic tachycardia   Returning to care today for follow-up of symptomatic tachycardia.  Toprol-XL 12.5 mg daily was initiated at her previous appointment.  She endorses symptomatic improvement and states that her heart rate is lower.  Episodes of restlessness occur less frequently.  We will plan to further increase Toprol-XL to 25 mg daily.      Return in about 6 months (around 12/02/2023).   Billie Lade, MD

## 2023-06-16 ENCOUNTER — Other Ambulatory Visit: Payer: Self-pay | Admitting: Internal Medicine

## 2023-06-19 ENCOUNTER — Ambulatory Visit
Admission: RE | Admit: 2023-06-19 | Discharge: 2023-06-19 | Disposition: A | Discharge status: Home / Self Care | Payer: Managed Care, Other (non HMO) | Source: Ambulatory Visit | Attending: Internal Medicine | Admitting: Internal Medicine

## 2023-06-19 DIAGNOSIS — Z1231 Encounter for screening mammogram for malignant neoplasm of breast: Secondary | ICD-10-CM

## 2023-06-21 ENCOUNTER — Telehealth: Payer: Self-pay

## 2023-06-21 ENCOUNTER — Encounter: Payer: Self-pay | Admitting: Internal Medicine

## 2023-06-21 ENCOUNTER — Other Ambulatory Visit: Payer: Self-pay | Admitting: Internal Medicine

## 2023-06-21 DIAGNOSIS — R928 Other abnormal and inconclusive findings on diagnostic imaging of breast: Secondary | ICD-10-CM

## 2023-06-21 NOTE — Telephone Encounter (Signed)
 Copied from CRM [id number]. Topic: General - Other >> Jun 21, 2023 12:02 PM Ja-Kwan M wrote: Reason for CRM: Patient requests to speak with Dr. Durwin Nora or his nurse regarding the order for the breast exam. Call back# [phone]

## 2023-06-23 NOTE — Telephone Encounter (Signed)
 Orders sent

## 2023-06-27 ENCOUNTER — Ambulatory Visit: Admitting: Nutrition

## 2023-06-28 NOTE — Assessment & Plan Note (Signed)
 Current weight 219 pounds.  BMI 35.4.  She was previously prescribed Wegovy but it is no longer covered by insurance.  She prefers to focus on lifestyle modifications aimed at weight loss and would like to establish care with medical nutrition therapy to discuss appropriate dietary changes aimed at weight loss.  Medical nutrition therapy referral placed today.

## 2023-06-28 NOTE — Assessment & Plan Note (Signed)
 Returning to care today for follow-up of symptomatic tachycardia.  Toprol-XL 12.5 mg daily was initiated at her previous appointment.  She endorses symptomatic improvement and states that her heart rate is lower.  Episodes of restlessness occur less frequently.  We will plan to further increase Toprol-XL to 25 mg daily.

## 2023-08-11 IMAGING — MG MM DIGITAL SCREENING BILAT W/ TOMO AND CAD
8 series · 8 of 24 positions shown · non-contrast
Comparison: Previous exam(s).

CLINICAL DATA: Screening.

EXAM:
DIGITAL SCREENING BILATERAL MAMMOGRAM WITH TOMOSYNTHESIS AND CAD
TECHNIQUE: Bilateral screening digital craniocaudal and mediolateral oblique
mammograms were obtained. Bilateral screening digital breast
tomosynthesis was performed. The images were evaluated with
computer-aided detection.

[R CC synth-2D]
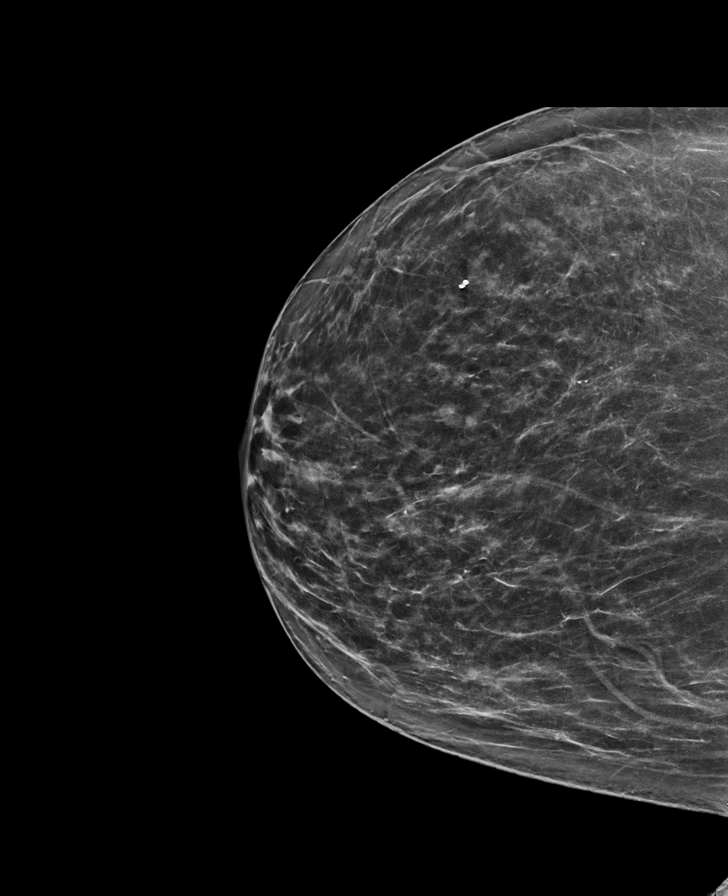

[L CC synth-2D]
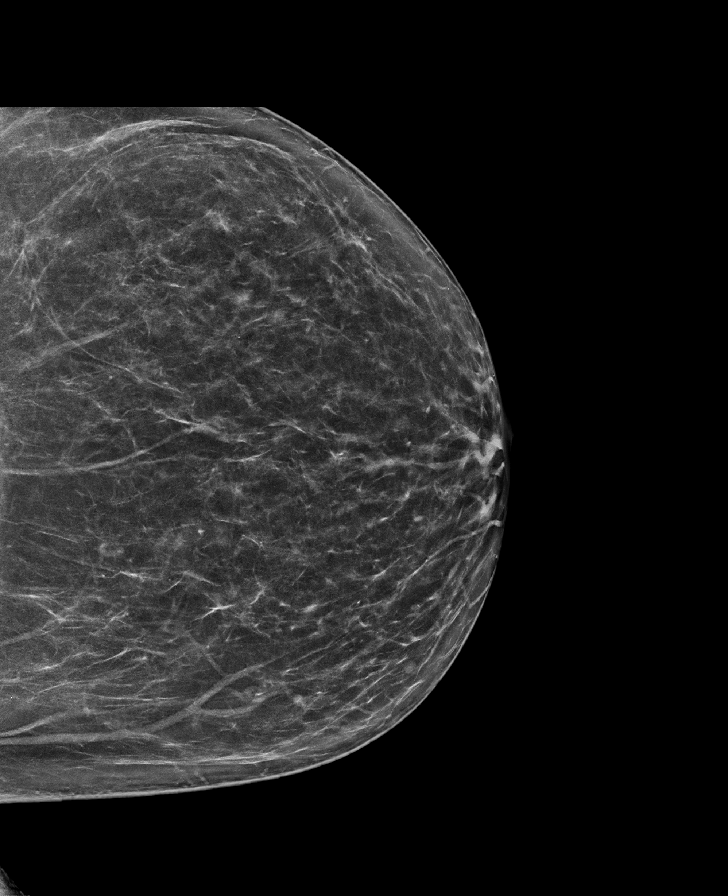

[L MLO synth-2D]
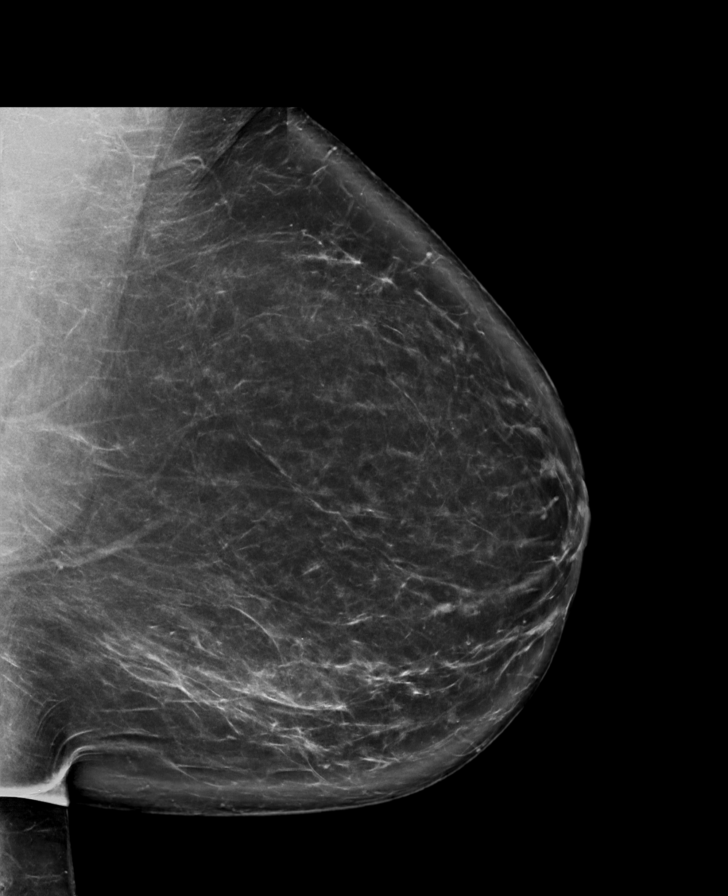

[R MLO synth-2D]
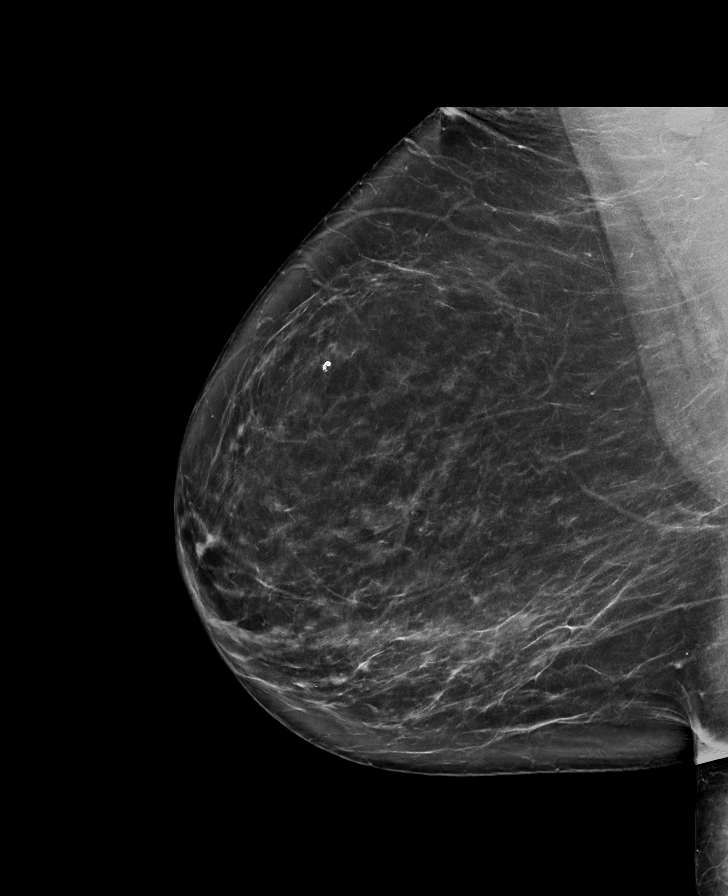

[L MLO tomo · tomo slice 52/103.0]
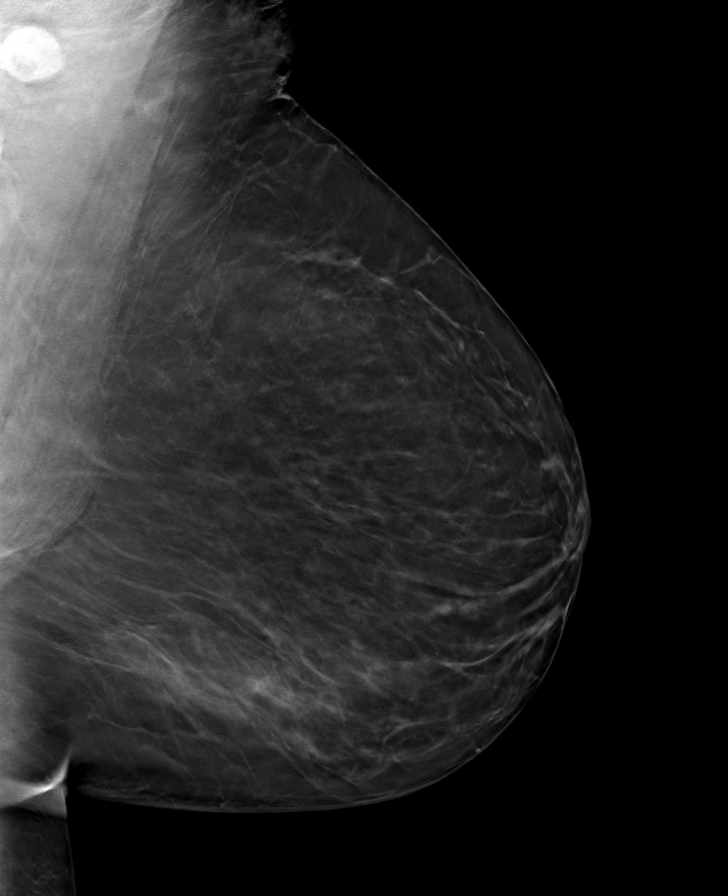

[R CC tomo · tomo slice 39/78.0]
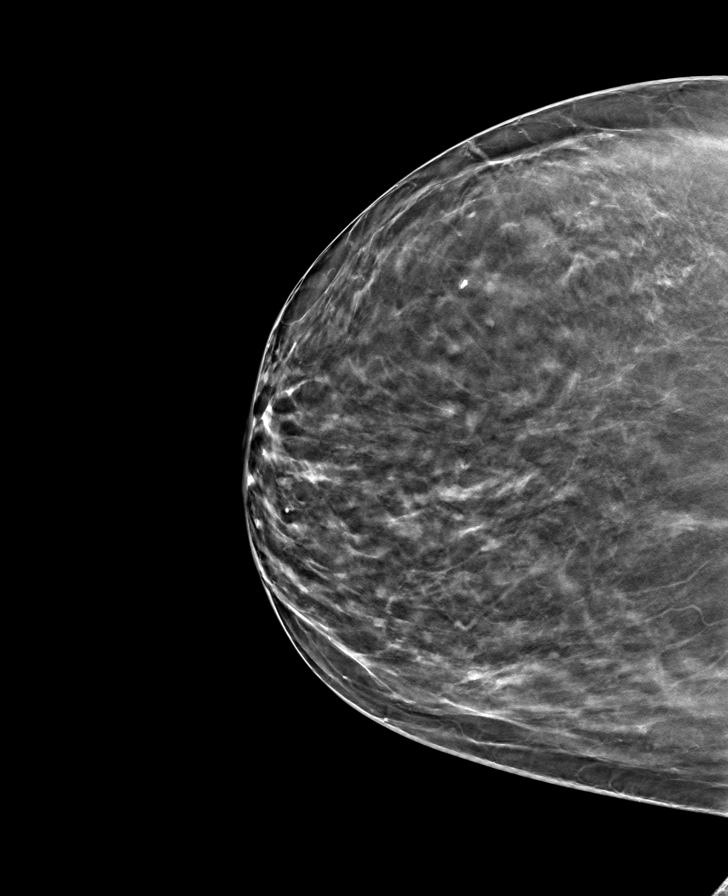

[R MLO tomo · tomo slice 49/97.0]
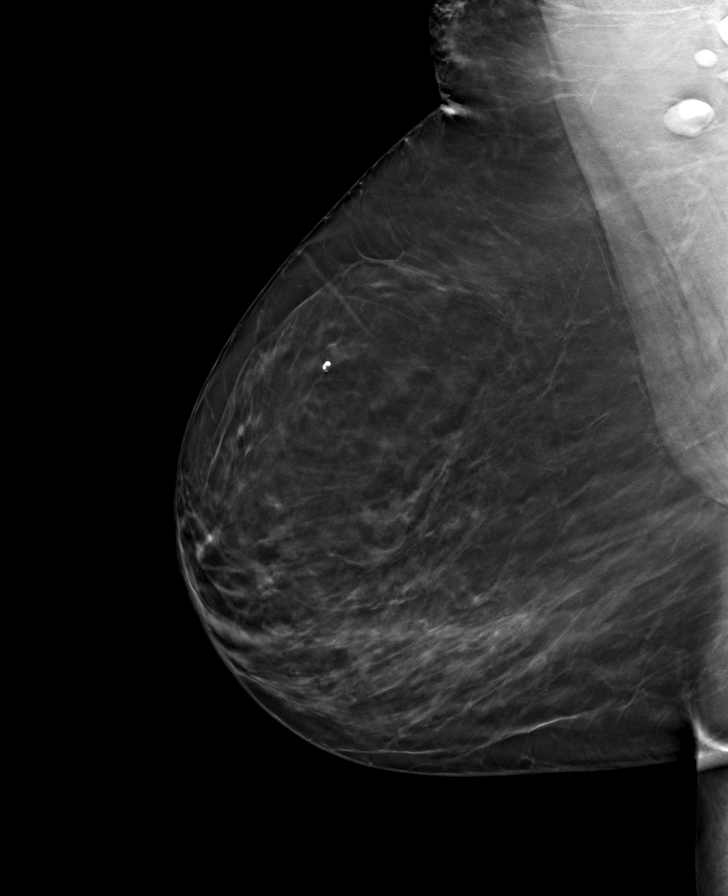

[L CC tomo · tomo slice 43/84.0]
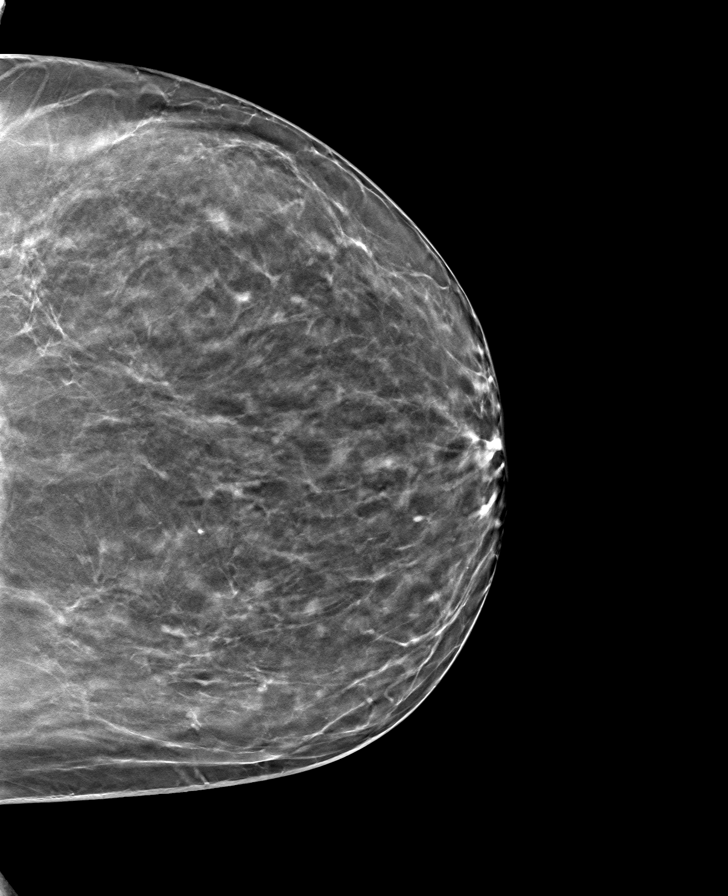

[8 of 24 positions shown; findings below may reference images not displayed]

ACR Breast Density Category b: There are scattered areas of
fibroglandular density.
FINDINGS: There are no findings suspicious for malignancy.
IMPRESSION: No mammographic evidence of malignancy. A result letter of this
screening mammogram will be mailed directly to the patient.

RECOMMENDATION:
Screening mammogram in one year. (Code:51-O-LD2)

BI-RADS CATEGORY  1: Negative.

## 2023-08-29 ENCOUNTER — Other Ambulatory Visit: Payer: Self-pay | Admitting: Adult Health

## 2023-09-08 ENCOUNTER — Ambulatory Visit: Payer: Self-pay

## 2023-09-08 ENCOUNTER — Other Ambulatory Visit (HOSPITAL_COMMUNITY): Payer: Self-pay

## 2023-09-08 VITALS — BP 136/85 | HR 118 | Ht 66.0 in | Wt 219.0 lb

## 2023-09-08 DIAGNOSIS — F419 Anxiety disorder, unspecified: Secondary | ICD-10-CM | POA: Diagnosis not present

## 2023-09-08 MED ORDER — SERTRALINE HCL 50 MG PO TABS
50.0000 mg | ORAL_TABLET | Freq: Every day | ORAL | 3 refills | Status: DC
  Filled 2023-09-08: qty 30, 30d supply, fill #0

## 2023-09-08 NOTE — Progress Notes (Unsigned)
 Established Patient Office Visit  Subjective   Patient ID: Carrie Santos, female    DOB: 1965-04-24  Age: 58 y.o. MRN: [id number]  Chief Complaint  Patient presents with   Medical Management of Chronic Issues    Pt here for behavioral health concern    HPI  Patient Active Problem List   Diagnosis Date Noted   Symptomatic tachycardia 04/21/2023   Vaccine reaction, initial encounter 01/31/2023   Screening for STD (sexually transmitted disease) 07/14/2022   Vaginal dryness 06/23/2022   Dyspareunia in female 06/23/2022   Osteoarthritis of left knee 12/21/2021   Essential hypertension 12/13/2021   Hyperlipidemia 12/13/2021   Obesity (BMI 30.0-34.9) 12/13/2021   Preventative health care 12/13/2021   Leg cramping 12/13/2021   Encounter for well woman exam with routine gynecological exam 12/13/2021   Encounter for screening fecal occult blood testing 12/13/2021   Fibroids 12/13/2021   Perimenopause 04/10/2017   Intramural leiomyoma of uterus 10/19/2015   Anxiety 09/21/2015   RLQ abdominal pain 12/02/2014   Enlarged uterus 12/02/2014   Hot flashes 12/02/2014      ROS    Objective:     BP 136/85   Pulse (!) 118   Ht 5' 6 (1.676 m)   Wt 219 lb 0.6 oz (99.4 kg)   LMP 06/05/2016 (Exact Date)   SpO2 97%   BMI 35.35 kg/m  BP Readings from Last 3 Encounters:  09/08/23 136/85  06/01/23 137/84  04/21/23 118/75   Wt Readings from Last 3 Encounters:  09/08/23 219 lb 0.6 oz (99.4 kg)  06/01/23 219 lb 9.6 oz (99.6 kg)  04/21/23 221 lb 6.4 oz (100.4 kg)      Physical Exam Vitals and nursing note reviewed.  Constitutional:      Appearance: Normal appearance.  HENT:     Head: Normocephalic.   Eyes:     Extraocular Movements: Extraocular movements intact.     Pupils: Pupils are equal, round, and reactive to light.    Cardiovascular:     Rate and Rhythm: Normal rate and regular rhythm.  Pulmonary:     Effort: Pulmonary effort is normal.     Breath sounds:  Normal breath sounds.   Musculoskeletal:     Cervical back: Normal range of motion and neck supple.   Neurological:     Mental Status: She is alert and oriented to person, place, and time.   Psychiatric:        Attention and Perception: Attention normal.        Mood and Affect: Mood is depressed. Affect is tearful.        Speech: Speech normal.        Thought Content: Thought content normal.     Comments: Pt is weepy when discussing the death of her father, which will be a year ago in August.        No results found for any visits on 09/08/23.   The 10-year ASCVD risk score (Arnett DK, et al., 2019) is: 7.2%    Assessment & Plan:   Patient verbally consented to [facility] services about presenting concerns and psychiatric consultation as appropriate. The services will be billed as appropriate for the patient.    Problem List Items Addressed This Visit       Other   Anxiety - Primary   She has taken sertraline  in the past, about 5 years ago.  We will restart sertraline  50 mg at this time and refer for behavioral  health consult.  Advised to watch for worsening of anxiety and/or depression.  Recommend follow-up in 4-6 weeks or sooner if needed.        Relevant Medications   sertraline  (ZOLOFT ) 50 MG tablet   Other Relevant Orders   Ambulatory referral to Behavioral Health    Return in about 6 weeks (around 10/20/2023).    Leita Longs, FNP

## 2023-09-10 NOTE — Assessment & Plan Note (Signed)
 She has taken sertraline  in the past, about 5 years ago.  We will restart sertraline  50 mg at this time and refer for behavioral health consult.  Advised to watch for worsening of anxiety and/or depression.  Recommend follow-up in 4-6 weeks or sooner if needed.

## 2023-09-19 ENCOUNTER — Other Ambulatory Visit (HOSPITAL_COMMUNITY): Payer: Self-pay

## 2023-09-27 ENCOUNTER — Other Ambulatory Visit: Payer: Self-pay | Admitting: Internal Medicine

## 2023-10-24 ENCOUNTER — Ambulatory Visit

## 2023-10-27 ENCOUNTER — Institutional Professional Consult (permissible substitution): Payer: Self-pay | Admitting: Professional Counselor

## 2023-12-09 ENCOUNTER — Other Ambulatory Visit: Payer: Self-pay | Admitting: Adult Health

## 2024-01-22 ENCOUNTER — Ambulatory Visit (INDEPENDENT_AMBULATORY_CARE_PROVIDER_SITE_OTHER): Payer: Self-pay

## 2024-01-22 VITALS — BP 137/85 | HR 87 | Ht 66.0 in | Wt 221.1 lb

## 2024-01-22 DIAGNOSIS — F419 Anxiety disorder, unspecified: Secondary | ICD-10-CM

## 2024-01-22 DIAGNOSIS — R000 Tachycardia, unspecified: Secondary | ICD-10-CM

## 2024-01-22 DIAGNOSIS — E669 Obesity, unspecified: Secondary | ICD-10-CM | POA: Diagnosis not present

## 2024-01-22 DIAGNOSIS — I10 Essential (primary) hypertension: Secondary | ICD-10-CM | POA: Diagnosis not present

## 2024-01-22 DIAGNOSIS — E782 Mixed hyperlipidemia: Secondary | ICD-10-CM

## 2024-01-22 MED ORDER — ATORVASTATIN CALCIUM 40 MG PO TABS: 40.0000 mg | ORAL_TABLET | Freq: Every day | ORAL | 3 refills | Status: AC

## 2024-01-22 MED ORDER — LISINOPRIL-HYDROCHLOROTHIAZIDE 10-12.5 MG PO TABS: 1.0000 | ORAL_TABLET | Freq: Every day | ORAL | 3 refills | Status: DC

## 2024-01-22 MED ORDER — METOPROLOL SUCCINATE ER 25 MG PO TB24: 25.0000 mg | ORAL_TABLET | Freq: Every day | ORAL | 3 refills | Status: AC

## 2024-01-22 MED ORDER — SERTRALINE HCL 50 MG PO TABS: 50.0000 mg | ORAL_TABLET | Freq: Every day | ORAL | 3 refills | Status: AC

## 2024-01-22 MED ORDER — WEGOVY 0.25 MG/0.5ML ~~LOC~~ SOAJ: 0.2500 mg | SUBCUTANEOUS | 1 refills | Status: AC

## 2024-01-22 NOTE — Progress Notes (Signed)
 Established Patient Office Visit  Subjective   Patient ID: Carrie Santos, female    DOB: 1965-12-27  Age: 58 y.o. MRN: [id number]  Chief Complaint  Patient presents with   Medical Management of Chronic Issues    6 month follow up     HPI Discussed the use of AI scribe software for clinical note transcription with the patient, who gave verbal consent to proceed.  History of Present Illness    Carrie Santos is a 58 year old female who presents for follow-up regarding weight management and anxiety.  Anxiety symptoms - Anxiety has improved since initiation of sertraline  (Zoloft ) at the last visit - Sertraline  is effective with good symptom control  Weight management - Primary concern is weight loss - Adheres to a balanced diet - Attributes weight challenges to menopause - Previously trialed Wegovy  for approximately one month with effective results, discontinued due to insurance coverage issues - Interested in alternative weight loss options covered by insurance - Pressure sore on knee, motivating desire for weight loss  Glycemic control and diabetes risk - Family history of diabetes in both parents - Monitors sugar intake closely - Last blood sugar level was 5.0 - No personal history of diabetes  Sleep apnea risk - No personal history of sleep apnea - Mother had sleep apnea  Antihypertensive therapy - Currently taking metoprolol  - No adverse effects or complaints related to metoprolol  - No lower extremity swelling     Patient Active Problem List   Diagnosis Date Noted   Obesity (BMI 30-39.9) 01/28/2024   Symptomatic tachycardia 04/21/2023   Vaccine reaction, initial encounter 01/31/2023   Screening for STD (sexually transmitted disease) 07/14/2022   Vaginal dryness 06/23/2022   Dyspareunia in female 06/23/2022   Osteoarthritis of left knee 12/21/2021   Essential hypertension 12/13/2021   Hyperlipidemia 12/13/2021   Obesity, Class II, BMI 35-39.9 12/13/2021    Preventative health care 12/13/2021   Leg cramping 12/13/2021   Encounter for well woman exam with routine gynecological exam 12/13/2021   Encounter for screening fecal occult blood testing 12/13/2021   Fibroids 12/13/2021   Perimenopause 04/10/2017   Intramural leiomyoma of uterus 10/19/2015   Anxiety 09/21/2015   RLQ abdominal pain 12/02/2014   Enlarged uterus 12/02/2014   Hot flashes 12/02/2014      ROS    Objective:     BP 137/85   Pulse 87   Ht 5' 6 (1.676 m)   Wt 221 lb 1.9 oz (100.3 kg)   LMP 06/05/2016 (Exact Date)   SpO2 94%   BMI 35.69 kg/m  BP Readings from Last 3 Encounters:  01/22/24 137/85  09/08/23 136/85  06/01/23 137/84   Wt Readings from Last 3 Encounters:  01/22/24 221 lb 1.9 oz (100.3 kg)  09/08/23 219 lb 0.6 oz (99.4 kg)  06/01/23 219 lb 9.6 oz (99.6 kg)      Physical Exam Vitals and nursing note reviewed.  Constitutional:      Appearance: Normal appearance.  HENT:     Head: Normocephalic.  Eyes:     Extraocular Movements: Extraocular movements intact.     Pupils: Pupils are equal, round, and reactive to light.  Cardiovascular:     Rate and Rhythm: Normal rate and regular rhythm.  Pulmonary:     Effort: Pulmonary effort is normal.     Breath sounds: Normal breath sounds.  Musculoskeletal:     Cervical back: Normal range of motion and neck supple.  Neurological:  Mental Status: She is alert and oriented to person, place, and time.  Psychiatric:        Mood and Affect: Mood normal.        Thought Content: Thought content normal.      No results found for any visits on 01/22/24.  Last CBC Lab Results  Component Value Date   WBC 8.3 04/21/2023   HGB 11.8 04/21/2023   HCT 38.7 04/21/2023   MCV 93 04/21/2023   MCH 28.3 04/21/2023   RDW 12.5 04/21/2023   PLT 386 04/21/2023   Last metabolic panel Lab Results  Component Value Date   GLUCOSE 90 04/21/2023   NA 140 04/21/2023   K 3.8 04/21/2023   CL 104 04/21/2023    CO2 22 04/21/2023   BUN 9 04/21/2023   CREATININE 0.63 04/21/2023   EGFR 103 04/21/2023   CALCIUM  9.4 04/21/2023   PROT 7.0 04/21/2023   ALBUMIN 4.2 04/21/2023   LABGLOB 2.8 04/21/2023   AGRATIO 1.5 06/15/2022   BILITOT 0.3 04/21/2023   ALKPHOS 75 04/21/2023   AST 18 04/21/2023   ALT 17 04/21/2023   Last lipids Lab Results  Component Value Date   CHOL 180 04/21/2023   HDL 52 04/21/2023   LDLCALC 109 (H) 04/21/2023   TRIG 103 04/21/2023   CHOLHDL 3.5 04/21/2023   Last hemoglobin A1c Lab Results  Component Value Date   HGBA1C 5.0 04/21/2023   Last thyroid functions Lab Results  Component Value Date   TSH 1.050 04/21/2023   FREET4 1.21 04/21/2023   Last vitamin D  Lab Results  Component Value Date   VD25OH 66.4 04/21/2023   Last vitamin B12 and Folate Lab Results  Component Value Date   VITAMINB12 1,895 (H) 04/21/2023   FOLATE 5.6 04/21/2023      The 10-year ASCVD risk score (Arnett DK, et al., 2019) is: 7.4%    Assessment & Plan:   Problem List Items Addressed This Visit       Cardiovascular and Mediastinum   Essential hypertension - Primary   Hypertension well-managed with metoprolol . - Continue metoprolol  as prescribed.      Relevant Medications   atorvastatin  (LIPITOR) 40 MG tablet   lisinopril -hydrochlorothiazide  (ZESTORETIC ) 10-12.5 MG tablet   metoprolol  succinate (TOPROL -XL) 25 MG 24 hr tablet     Other   Anxiety   Anxiety well-controlled with sertraline . - Continue current medication regimen.      Relevant Medications   sertraline  (ZOLOFT ) 50 MG tablet   Hyperlipidemia   Currently prescribed atorvastatin  20 mg daily.        Relevant Medications   atorvastatin  (LIPITOR) 40 MG tablet   lisinopril -hydrochlorothiazide  (ZESTORETIC ) 10-12.5 MG tablet   metoprolol  succinate (TOPROL -XL) 25 MG 24 hr tablet   Symptomatic tachycardia   Stable with current dose of metoprolol  XL 25 mg.        Relevant Medications   metoprolol  succinate  (TOPROL -XL) 25 MG 24 hr tablet   Obesity (BMI 30-39.9)   Obesity with a goal to lose 35 pounds. Previous Wegovy  effective but discontinued due to insurance. Seeks pharmacological aid for weight loss. - Send Wegovy  prescription to pharmacy for insurance check. - Consider phentermine if Wegovy  not covered.       Relevant Medications   semaglutide -weight management (WEGOVY ) 0.25 MG/0.5ML SOAJ SQ injection    No follow-ups on file.    Leita Longs, FNP

## 2024-01-28 DIAGNOSIS — E669 Obesity, unspecified: Secondary | ICD-10-CM | POA: Insufficient documentation

## 2024-01-28 NOTE — Assessment & Plan Note (Signed)
 Hypertension well-managed with metoprolol . - Continue metoprolol  as prescribed.

## 2024-01-28 NOTE — Assessment & Plan Note (Signed)
 Anxiety well-controlled with sertraline . - Continue current medication regimen.

## 2024-01-28 NOTE — Assessment & Plan Note (Signed)
 Currently prescribed atorvastatin  20 mg daily.

## 2024-01-28 NOTE — Assessment & Plan Note (Signed)
 Stable with current dose of metoprolol  XL 25 mg.

## 2024-01-28 NOTE — Assessment & Plan Note (Signed)
 Obesity with a goal to lose 35 pounds. Previous Wegovy  effective but discontinued due to insurance. Seeks pharmacological aid for weight loss. - Send Wegovy  prescription to pharmacy for insurance check. - Consider phentermine if Wegovy  not covered.

## 2024-01-30 ENCOUNTER — Other Ambulatory Visit (HOSPITAL_COMMUNITY): Payer: Self-pay

## 2024-02-21 ENCOUNTER — Other Ambulatory Visit: Payer: Self-pay

## 2024-02-21 DIAGNOSIS — I10 Essential (primary) hypertension: Secondary | ICD-10-CM

## 2024-07-23 ENCOUNTER — Ambulatory Visit
